# Patient Record
Sex: Female | Born: 1955 | Race: White | Hispanic: No | Marital: Single | Smoking: Never smoker
Health system: Southern US, Community
[De-identification: ages and names within clinical notes are randomized; demographics above are authoritative.]

## PROBLEM LIST (undated history)

## (undated) HISTORY — PX: ABDOMINAL HYSTERECTOMY: SHX81

## (undated) HISTORY — PX: APPENDECTOMY: SHX54

---

## 2018-06-29 ENCOUNTER — Other Ambulatory Visit: Payer: Self-pay

## 2018-06-29 ENCOUNTER — Emergency Department (HOSPITAL_COMMUNITY): Payer: No Typology Code available for payment source

## 2018-06-29 ENCOUNTER — Inpatient Hospital Stay (HOSPITAL_COMMUNITY)
Admission: EM | Admit: 2018-06-29 | Discharge: 2018-07-02 | DRG: 493 | Disposition: A | Discharge status: Home Health | Payer: No Typology Code available for payment source | Attending: Student | Admitting: Student

## 2018-06-29 ENCOUNTER — Encounter (HOSPITAL_COMMUNITY): Payer: Self-pay | Admitting: Emergency Medicine

## 2018-06-29 DIAGNOSIS — S82201A Unspecified fracture of shaft of right tibia, initial encounter for closed fracture: Secondary | ICD-10-CM | POA: Diagnosis present

## 2018-06-29 DIAGNOSIS — S82451A Displaced comminuted fracture of shaft of right fibula, initial encounter for closed fracture: Secondary | ICD-10-CM | POA: Diagnosis present

## 2018-06-29 DIAGNOSIS — Y92009 Unspecified place in unspecified non-institutional (private) residence as the place of occurrence of the external cause: Secondary | ICD-10-CM

## 2018-06-29 DIAGNOSIS — S82142A Displaced bicondylar fracture of left tibia, initial encounter for closed fracture: Secondary | ICD-10-CM

## 2018-06-29 DIAGNOSIS — S82141A Displaced bicondylar fracture of right tibia, initial encounter for closed fracture: Secondary | ICD-10-CM | POA: Diagnosis not present

## 2018-06-29 DIAGNOSIS — I959 Hypotension, unspecified: Secondary | ICD-10-CM | POA: Diagnosis not present

## 2018-06-29 DIAGNOSIS — D62 Acute posthemorrhagic anemia: Secondary | ICD-10-CM | POA: Diagnosis not present

## 2018-06-29 DIAGNOSIS — Z90710 Acquired absence of both cervix and uterus: Secondary | ICD-10-CM

## 2018-06-29 DIAGNOSIS — M79604 Pain in right leg: Secondary | ICD-10-CM | POA: Diagnosis not present

## 2018-06-29 DIAGNOSIS — Z1159 Encounter for screening for other viral diseases: Secondary | ICD-10-CM

## 2018-06-29 DIAGNOSIS — W109XXA Fall (on) (from) unspecified stairs and steps, initial encounter: Secondary | ICD-10-CM | POA: Diagnosis present

## 2018-06-29 DIAGNOSIS — Z419 Encounter for procedure for purposes other than remedying health state, unspecified: Secondary | ICD-10-CM

## 2018-06-29 LAB — CBC WITH DIFFERENTIAL/PLATELET
Abs Immature Granulocytes: 0.05 10*3/uL (ref 0.00–0.07)
Basophils Absolute: 0 10*3/uL (ref 0.0–0.1)
Basophils Relative: 0 %
Eosinophils Absolute: 0 10*3/uL (ref 0.0–0.5)
Eosinophils Relative: 0 %
HCT: 36.9 % (ref 36.0–46.0)
Hemoglobin: 12 g/dL (ref 12.0–15.0)
Immature Granulocytes: 0 %
Lymphocytes Relative: 5 %
Lymphs Abs: 0.6 10*3/uL — ABNORMAL LOW (ref 0.7–4.0)
MCH: 30.5 pg (ref 26.0–34.0)
MCHC: 32.5 g/dL (ref 30.0–36.0)
MCV: 93.9 fL (ref 80.0–100.0)
Monocytes Absolute: 0.6 10*3/uL (ref 0.1–1.0)
Monocytes Relative: 5 %
Neutro Abs: 11.6 10*3/uL — ABNORMAL HIGH (ref 1.7–7.7)
Neutrophils Relative %: 90 %
Platelets: 208 10*3/uL (ref 150–400)
RBC: 3.93 MIL/uL (ref 3.87–5.11)
RDW: 12.6 % (ref 11.5–15.5)
WBC: 12.9 10*3/uL — ABNORMAL HIGH (ref 4.0–10.5)
nRBC: 0 % (ref 0.0–0.2)

## 2018-06-29 LAB — BASIC METABOLIC PANEL
Anion gap: 11 (ref 5–15)
BUN: 19 mg/dL (ref 8–23)
CO2: 24 mmol/L (ref 22–32)
Calcium: 9.2 mg/dL (ref 8.9–10.3)
Chloride: 107 mmol/L (ref 98–111)
Creatinine, Ser: 0.79 mg/dL (ref 0.44–1.00)
GFR calc Af Amer: >60 mL/min (ref >60)
GFR calc non Af Amer: >60 mL/min (ref >60)
Glucose, Bld: 157 mg/dL — ABNORMAL HIGH (ref 70–99)
Potassium: 3.6 mmol/L (ref 3.5–5.1)
Sodium: 142 mmol/L (ref 135–145)

## 2018-06-29 LAB — SARS CORONAVIRUS 2 BY RT PCR (HOSPITAL ORDER, PERFORMED IN ~~LOC~~ HOSPITAL LAB): SARS Coronavirus 2: NEGATIVE

## 2018-06-29 MED ORDER — METHOCARBAMOL 500 MG PO TABS
500.0000 mg | ORAL_TABLET | Freq: Four times a day (QID) | ORAL | Status: DC | PRN
  Administered 2018-06-29 – 2018-07-01 (×4): 500 mg via ORAL
  Filled 2018-06-29 (×4): qty 1

## 2018-06-29 MED ORDER — HYDROMORPHONE HCL 1 MG/ML IJ SOLN
1.0000 mg | Freq: Once | INTRAMUSCULAR | Status: AC
  Administered 2018-06-29: 1 mg via INTRAVENOUS
  Filled 2018-06-29: qty 1

## 2018-06-29 MED ORDER — ACETAMINOPHEN 500 MG PO TABS
1000.0000 mg | ORAL_TABLET | Freq: Three times a day (TID) | ORAL | Status: DC
  Administered 2018-06-29 – 2018-07-02 (×7): 1000 mg via ORAL
  Filled 2018-06-29 (×8): qty 2

## 2018-06-29 MED ORDER — LACTATED RINGERS IV SOLN
INTRAVENOUS | Status: DC
  Administered 2018-06-29: 23:00:00 via INTRAVENOUS

## 2018-06-29 MED ORDER — ONDANSETRON HCL 4 MG/2ML IJ SOLN
4.0000 mg | Freq: Four times a day (QID) | INTRAMUSCULAR | Status: DC | PRN
  Administered 2018-06-30 (×2): 4 mg via INTRAVENOUS
  Filled 2018-06-29 (×2): qty 2

## 2018-06-29 MED ORDER — OXYCODONE HCL 5 MG PO TABS
5.0000 mg | ORAL_TABLET | ORAL | Status: DC | PRN
  Administered 2018-06-29 – 2018-06-30 (×3): 10 mg via ORAL
  Administered 2018-06-30: 5 mg via ORAL
  Administered 2018-07-01 – 2018-07-02 (×4): 10 mg via ORAL
  Filled 2018-06-29 (×8): qty 2

## 2018-06-29 MED ORDER — CELECOXIB 200 MG PO CAPS
200.0000 mg | ORAL_CAPSULE | Freq: Two times a day (BID) | ORAL | Status: DC
  Administered 2018-06-29: 200 mg via ORAL
  Filled 2018-06-29: qty 1

## 2018-06-29 MED ORDER — DIPHENHYDRAMINE HCL 12.5 MG/5ML PO ELIX: 12.5000 mg | ORAL_SOLUTION | ORAL | Status: DC | PRN

## 2018-06-29 MED ORDER — ONDANSETRON HCL 4 MG PO TABS: 4.0000 mg | ORAL_TABLET | Freq: Four times a day (QID) | ORAL | Status: DC | PRN

## 2018-06-29 MED ORDER — ONDANSETRON HCL 4 MG/2ML IJ SOLN
4.0000 mg | Freq: Once | INTRAMUSCULAR | Status: AC
  Administered 2018-06-29: 4 mg via INTRAVENOUS
  Filled 2018-06-29: qty 2

## 2018-06-29 MED ORDER — SENNOSIDES-DOCUSATE SODIUM 8.6-50 MG PO TABS: 1.0000 | ORAL_TABLET | Freq: Every evening | ORAL | Status: DC | PRN

## 2018-06-29 MED ORDER — METHOCARBAMOL 1000 MG/10ML IJ SOLN
500.0000 mg | Freq: Four times a day (QID) | INTRAVENOUS | Status: DC | PRN
  Filled 2018-06-29: qty 5

## 2018-06-29 MED ORDER — HYDROMORPHONE HCL 1 MG/ML IJ SOLN
1.0000 mg | Freq: Once | INTRAMUSCULAR | Status: AC
  Administered 2018-06-29: 21:00:00 1 mg via INTRAVENOUS
  Filled 2018-06-29: qty 1

## 2018-06-29 MED ORDER — HYDROMORPHONE HCL 1 MG/ML IJ SOLN
1.0000 mg | Freq: Once | INTRAMUSCULAR | Status: AC
  Administered 2018-06-29: 22:00:00 1 mg via INTRAVENOUS
  Filled 2018-06-29: qty 1

## 2018-06-29 MED ORDER — HYDROMORPHONE HCL 1 MG/ML IJ SOLN: 0.5000 mg | INTRAMUSCULAR | Status: DC | PRN

## 2018-06-29 MED ORDER — DOCUSATE SODIUM 100 MG PO CAPS
100.0000 mg | ORAL_CAPSULE | Freq: Two times a day (BID) | ORAL | Status: DC
  Administered 2018-06-29 – 2018-07-02 (×5): 100 mg via ORAL
  Filled 2018-06-29 (×5): qty 1

## 2018-06-29 NOTE — ED Notes (Signed)
ED TO INPATIENT HANDOFF REPORT  ED Nurse Name and Phone #: [phone] Theresa Dean  S Name/Age/Gender Theresa Dean 63 y.o. female Room/Bed: 043C/043C  Code Status   Code Status: Not on file  Home/SNF/Other Home Patient oriented to: self, place, time and situation Is this baseline? Yes   Triage Complete: Triage complete  Chief Complaint fracture  Triage Note Pt brought in by [facility], Pt fell down approximately 6 steps while carrying a cabinet. Pt has obvious deformity and significant swelling to RLE. EMS placed a splint on RLE Pt received 150 mcg of fentanyl and 8 mg of zofran en route due to having 2 episodes of vomiting after receiving fentanyl.. Pt denies head or neck pain, back pain, LOC, denies hitting her head.    Allergies No Known Allergies  Level of Care/Admitting Diagnosis ED Disposition    ED Disposition Condition Comment   Admit  [facility]ospital Area: [facility] [[id number]]  Level of Care: Med-Surg [16]  Covid Evaluation: N/A  Diagnosis: Right tibial fracture [454098][[id number]]  Admitting Physician: Bjorn PippinVARKEY, DAX T [[id number]]  Attending Physician: Bjorn PippinVARKEY, DAX T [[id number]]  PT Class (Do Not Modify): Observation [104]  PT Acc Code (Do Not Modify): Observation [10022]       B Medical/Surgery History History reviewed. No pertinent past medical history. Past Surgical History:  Procedure Laterality Date  . ABDOMINAL HYSTERECTOMY    . APPENDECTOMY       A IV Location/Drains/Wounds Patient Lines/Drains/Airways Status   Active Line/Drains/Airways    Name:   Placement date:   Placement time:   Site:   Days:   Peripheral IV 06/29/18 Right Antecubital   06/29/18    1735    Antecubital   less than 1          Intake/Output Last 24 hours No intake or output data in the 24 hours ending 06/29/18 2129  Labs/Imaging Results for orders placed or performed during the hospital encounter of 06/29/18 (from the past 48 hour(s))  Basic metabolic panel     Status: Abnormal    Collection Time: 06/29/18  8:38 PM  Result Value Ref Range   Sodium 142 135 - 145 mmol/L   Potassium 3.6 3.5 - 5.1 mmol/L   Chloride 107 98 - 111 mmol/L   CO2 24 22 - 32 mmol/L   Glucose, Bld 157 (H) 70 - 99 mg/dL   BUN 19 8 - 23 mg/dL   Creatinine, Ser 0.860.79 0.44 - 1.00 mg/dL   Calcium 9.2 8.9 - 57.810.3 mg/dL   GFR calc non Af Amer >60 >60 mL/min   GFR calc Af Amer >60 >60 mL/min   Anion gap 11 5 - 15    Comment: Performed at [facility], [address] [place], [place] [address]  CBC with Differential     Status: Abnormal   Collection Time: 06/29/18  8:38 PM  Result Value Ref Range   WBC 12.9 (H) 4.0 - 10.5 K/uL   RBC 3.93 3.87 - 5.11 MIL/uL   Hemoglobin 12.0 12.0 - 15.0 g/dL   HCT 95.236.9 84.136.0 - 32.446.0 %   MCV 93.9 80.0 - 100.0 fL   MCH 30.5 26.0 - 34.0 pg   MCHC 32.5 30.0 - 36.0 g/dL   RDW 40.112.6 02.711.5 - 25.315.5 %   Platelets 208 150 - 400 K/uL   nRBC 0.0 0.0 - 0.2 %   Neutrophils Relative % 90 %   Neutro Abs 11.6 (H) 1.7 - 7.7 K/uL   Lymphocytes Relative  5 %   Lymphs Abs 0.6 (L) 0.7 - 4.0 K/uL   Monocytes Relative 5 %   Monocytes Absolute 0.6 0.1 - 1.0 K/uL   Eosinophils Relative 0 %   Eosinophils Absolute 0.0 0.0 - 0.5 K/uL   Basophils Relative 0 %   Basophils Absolute 0.0 0.0 - 0.1 K/uL   Immature Granulocytes 0 %   Abs Immature Granulocytes 0.05 0.00 - 0.07 K/uL    Comment: Performed at [facility], [place] [address], [place], [place] [address]   Dg Tibia/fibula Right  Result Date: 06/29/2018 CLINICAL DATA:  63 year old female with fall and right lower extremity pain. EXAM: RIGHT TIBIA AND FIBULA - 2 VIEW COMPARISON:  None. FINDINGS: There is a displaced oblique and comminuted fracture of the proximal tibia with extension of the fracture into the articular surface. Mildly depressed fracture component of the posterior aspect of the lateral tibial plateau noted. There is overall approximately 1 cm lateral displacement of the tibial shaft in relation to the major  proximal fracture fragment. There is a mildly displaced and comminuted appearing fracture of the fibular head. No dislocation. The bones are osteopenic. The ankle mortise is intact. There is moderate suprapatellar effusion, likely a hemarthrosis. There is soft tissue swelling of the anterior calf. IMPRESSION: Comminuted and displaced fractures of the proximal tibia and fracture of the fibular head as described. Electronically Signed   By: Anner Crete M.D.   On: 06/29/2018 20:10   Ct Knee Right Wo Contrast  Result Date: 06/29/2018 CLINICAL DATA:  [address] downstairs. Evaluate complex tibial plateau fractures. EXAM: CT OF THE right KNEE WITHOUT CONTRAST TECHNIQUE: Multidetector CT imaging of the right knee was performed according to the standard protocol. Multiplanar CT image reconstructions were also generated. COMPARISON:  Radiograph 06/29/2018 FINDINGS: Complex comminuted fractures of the tibial plateau both medially and laterally. The lateral tibial plateau fracture is a comminuted die punch type fracture with depression of the central fragment approximately 6 mm. There is a long oblique coursing fracture through the metadiaphyseal region of the proximal tibia and comminuted fracture involving the lateral cortex below the joint. Depressed medial tibial plateau fracture with maximum depression of 4.5 mm 4 mm defect along the articular surface. There is also a comminuted fracture of the medial cortex of the tibial metaphysis. Complex comminuted and displaced fractures of the fibular head and neck. No fracture of the femur or patella. Large lipohemarthrosis. Grossly the cruciate ligaments are intact. The medial collateral ligament also appears to be intact but the lateral collateral ligament complex is not well demonstrated other than the intact iliotibial band. The quadriceps and patellar tendons are intact. Large lipohemarthrosis. IMPRESSION: Complex comminuted medial and lateral tibial plateau fractures and  fractures through the metadiaphyseal region of the tibia. Complex comminuted fractures of the fibular head and neck. Large lipohemarthrosis. Electronically Signed   By: Marijo Sanes M.D.   On: 06/29/2018 20:57    Pending Labs Unresulted Labs (From admission, onward)    Start     Ordered   06/29/18 2003  SARS Coronavirus 2 (CEPHEID - Performed in [place] hospital lab), Hosp Order  (Asymptomatic Patients Labs)  Once,   STAT    Question:  Rule Out  Answer:  Yes   06/29/18 2002   Signed and Held  HIV antibody (Routine Testing)  Once,   R     Signed and Held   Signed and [employer]  Once,   [employer]  and Held          Vitals/Pain Today's Vitals   06/29/18 1921 06/29/18 1927 06/29/18 2125 06/29/18 2127  BP:    (!) 147/71  Pulse:    71  Resp:    18  Temp:      TempSrc:      SpO2:    100%  Weight:  62.6 kg    Height:  5\' 4"  (1.626 m)    PainSc: 8   9      Isolation Precautions No active isolations  Medications Medications  HYDROmorphone (DILAUDID) injection 1 mg (has no administration in time range)  HYDROmorphone (DILAUDID) injection 1 mg (1 mg Intravenous Given 06/29/18 1801)  ondansetron (ZOFRAN) injection 4 mg (4 mg Intravenous Given 06/29/18 1824)  HYDROmorphone (DILAUDID) injection 1 mg (1 mg Intravenous Given 06/29/18 1915)  ondansetron (ZOFRAN) injection 4 mg (4 mg Intravenous Given 06/29/18 1915)  HYDROmorphone (DILAUDID) injection 1 mg (1 mg Intravenous Given 06/29/18 2047)  ondansetron (ZOFRAN) injection 4 mg (4 mg Intravenous Given 06/29/18 2047)    Mobility walks Moderate fall risk(due to injury)   Focused Assessments Musculoskeletal assessment   R Recommendations: See Admitting Provider Note  Report given to:   Additional Notes: Will medicate prior to transport.  Pt has increased pain post knee immobilizer being placed to hold leg together.

## 2018-06-29 NOTE — ED Triage Notes (Signed)
Pt brought in by [facility], Pt fell down approximately 6 steps while carrying a cabinet. Pt has obvious deformity and significant swelling to RLE. EMS placed a splint on RLE Pt received 150 mcg of fentanyl and 8 mg of zofran en route due to having 2 episodes of vomiting after receiving fentanyl.. Pt denies head or neck pain, back pain, LOC, denies hitting her head.

## 2018-06-29 NOTE — ED Provider Notes (Signed)
[facility] EMERGENCY DEPARTMENT Provider Note   CSN: [id number] Arrival date & time: 06/29/18  1726    History   Chief Complaint Chief Complaint  Patient presents with  . Fall  . Leg Injury    HPI Theresa Dean is a 63 y.o. female.     HPI   Resents by EMS for evaluation of isolated injury to the right lower leg, when she fell down several steps, at home.  She is not sure why she fell but thinks she slipped.  She was treated for pain with IV fentanyl and Zofran for nausea, improved.  Denies headache, neck pain, chest pain, weakness or dizziness.  There are no other known modifying factors.  History reviewed. No pertinent past medical history.  There are no active problems to display for this patient.   Past Surgical History:  Procedure Laterality Date  . ABDOMINAL HYSTERECTOMY    . APPENDECTOMY       OB History   No obstetric history on file.      Home Medications    Prior to Admission medications   Not on File    Family History No family history on file.  Social History Social History   Tobacco Use  . Smoking status: Never Smoker  . Smokeless tobacco: Never Used  Substance Use Topics  . Alcohol use: Yes    Comment: occasional  . Drug use: Never     Allergies   Patient has no known allergies.   Review of Systems Review of Systems  All other systems reviewed and are negative.    Physical Exam Updated Vital Signs BP (!) 146/81   Pulse 69   Temp 97.6 F (36.4 C) (Oral)   Resp 10   Ht 5\' 4"  (1.626 m)   Wt 62.6 kg   SpO2 100%   BMI 23.69 kg/m   Physical Exam Vitals signs and nursing note reviewed.  Constitutional:      General: She is not in acute distress.    Appearance: She is well-developed. She is not ill-appearing, toxic-appearing or diaphoretic.  HENT:     Head: Normocephalic and atraumatic.     Right Ear: External ear normal.     Left Ear: External ear normal.     Mouth/Throat:     Pharynx: No  oropharyngeal exudate or posterior oropharyngeal erythema.  Eyes:     Conjunctiva/sclera: Conjunctivae normal.     Pupils: Pupils are equal, round, and reactive to light.  Neck:     Musculoskeletal: Normal range of motion and neck supple.     Trachea: Phonation normal.  Cardiovascular:     Rate and Rhythm: Normal rate and regular rhythm.     Heart sounds: Normal heart sounds.  Pulmonary:     Effort: Pulmonary effort is normal.     Breath sounds: Normal breath sounds.  Abdominal:     Palpations: Abdomen is soft.     Tenderness: There is no abdominal tenderness.  Musculoskeletal:     Comments: Tenderness of the cervical spine.  Right leg with tenderness, swelling and ecchymosis, below the knee, primarily lateral.  Neurovascularly and functionally intact distally in the right foot.  Skin:    General: Skin is warm and dry.  Neurological:     Mental Status: She is alert and oriented to person, place, and time.     Cranial Nerves: No cranial nerve deficit.     Sensory: No sensory deficit.     Motor: No abnormal muscle  tone.     Coordination: Coordination normal.  Psychiatric:        Mood and Affect: Mood normal.        Behavior: Behavior normal.        Thought Content: Thought content normal.        Judgment: Judgment normal.      ED Treatments / Results  Labs (all labs ordered are listed, but only abnormal results are displayed) Labs Reviewed  SARS CORONAVIRUS 2 (HOSPITAL ORDER, [place] [facility])  BASIC METABOLIC PANEL  CBC WITH DIFFERENTIAL/PLATELET    EKG None  Radiology No results found.  Procedures Procedures (including critical care time)  Medications Ordered in ED Medications  HYDROmorphone (DILAUDID) injection 1 mg (1 mg Intravenous Given 06/29/18 1801)  ondansetron (ZOFRAN) injection 4 mg (4 mg Intravenous Given 06/29/18 1824)  HYDROmorphone (DILAUDID) injection 1 mg (1 mg Intravenous Given 06/29/18 1915)  ondansetron (ZOFRAN) injection 4  mg (4 mg Intravenous Given 06/29/18 1915)     Initial Impression / Assessment and Plan / ED Course  I have reviewed the triage vital signs and the nursing notes.  Pertinent labs & imaging results that were available during my care of the patient were reviewed by me and considered in my medical decision making (see chart for details).  Clinical Course as of Jun 28 2001  Tue Jun 29, 2018  2002 Displaced proximal tibia and fibula fracture, images reviewed by me  DG Tibia/Fibula Right [EW]    Clinical Course User Index [EW] Daleen Bo, MD        Patient Vitals for the past 24 hrs:  BP Temp Temp src Pulse Resp SpO2 Height Weight  06/29/18 1927 - - - - - - 5\' 4"  (1.626 m) 62.6 kg  06/29/18 1915 (!) 146/81 - - 69 10 100 % - -  06/29/18 1900 136/77 - - 71 11 100 % - -  06/29/18 1845 134/81 - - 74 15 99 % - -  06/29/18 1830 (!) 144/82 - - 78 13 99 % - -  06/29/18 1815 134/74 - - 71 15 97 % - -  06/29/18 1800 132/70 - - 64 11 100 % - -  06/29/18 1745 136/68 - - 68 16 98 % - -  06/29/18 1739 139/78 97.6 F (36.4 C) Oral 68 18 98 % - -    8:03 PM Reevaluation with update and discussion. After initial assessment and treatment, an updated evaluation reveals she is more comfortable at this time.  Right leg evaluated again, there is no break in the skin.  She continues to have sensation and function of the right foot.  Patient is agreeable to hospitalization and further work-up.  Orthopedics has been paged. Daleen Bo   Medical Decision Making: Mechanical fall with isolated injury proximal right lower leg.  Patient will require intervention with surgical repair, prior to discharge from the hospital  CRITICAL CARE-no Performed by: Daleen Bo   Nursing Notes Reviewed/ Care Coordinated Applicable Imaging Reviewed Interpretation of Laboratory Data incorporated into ED treatment  8:05 PM-discussed with Dr. Griffin Basil, he will put in admission orders and see the patient tomorrow for  operative intervention.  Request that she be placed in a knee immobilizer.  Plan, admit to orthopedics.   Final Clinical Impressions(s) / ED Diagnoses   Final diagnoses:  Tibia/fibula fracture, right, closed, initial encounter    ED Discharge Orders    None       Daleen Bo, MD 06/29/18  2008  

## 2018-06-29 NOTE — Consult Note (Signed)
Called about R tibia fracture.  Patient reportedly per ER has no signs of compartment syndrome, moderate swelling, NVID.  Plan for ORIF vs Ex fix tomorrow likely w Dr. Doreatha Martin.  NPO after midnight.  Posting for OR.

## 2018-06-30 ENCOUNTER — Observation Stay (HOSPITAL_COMMUNITY): Payer: No Typology Code available for payment source | Admitting: Certified Registered Nurse Anesthetist

## 2018-06-30 ENCOUNTER — Encounter (HOSPITAL_COMMUNITY): Payer: Self-pay | Admitting: *Deleted

## 2018-06-30 ENCOUNTER — Observation Stay (HOSPITAL_COMMUNITY): Payer: No Typology Code available for payment source

## 2018-06-30 ENCOUNTER — Encounter (HOSPITAL_COMMUNITY)
Admission: EM | Disposition: A | Discharge status: Home Health | Payer: Self-pay | Source: Home / Self Care | Attending: Student

## 2018-06-30 HISTORY — PX: ORIF TIBIA PLATEAU: SHX2132

## 2018-06-30 LAB — SURGICAL PCR SCREEN
MRSA, PCR: NEGATIVE
Staphylococcus aureus: NEGATIVE

## 2018-06-30 LAB — PROTIME-INR
INR: 1.1 (ref 0.8–1.2)
Prothrombin Time: 13.8 seconds (ref 11.4–15.2)

## 2018-06-30 LAB — HIV ANTIBODY (ROUTINE TESTING W REFLEX): HIV Screen 4th Generation wRfx: NONREACTIVE

## 2018-06-30 IMAGING — DX PORTABLE RIGHT KNEE - 1-2 VIEW
3 series · 3 of 3 positions shown · non-contrast
Comparison: [DATE], [DATE]

CLINICAL DATA: Postop

EXAM:
PORTABLE RIGHT KNEE - 1-2 VIEW

[knee ap (1 of 2)]
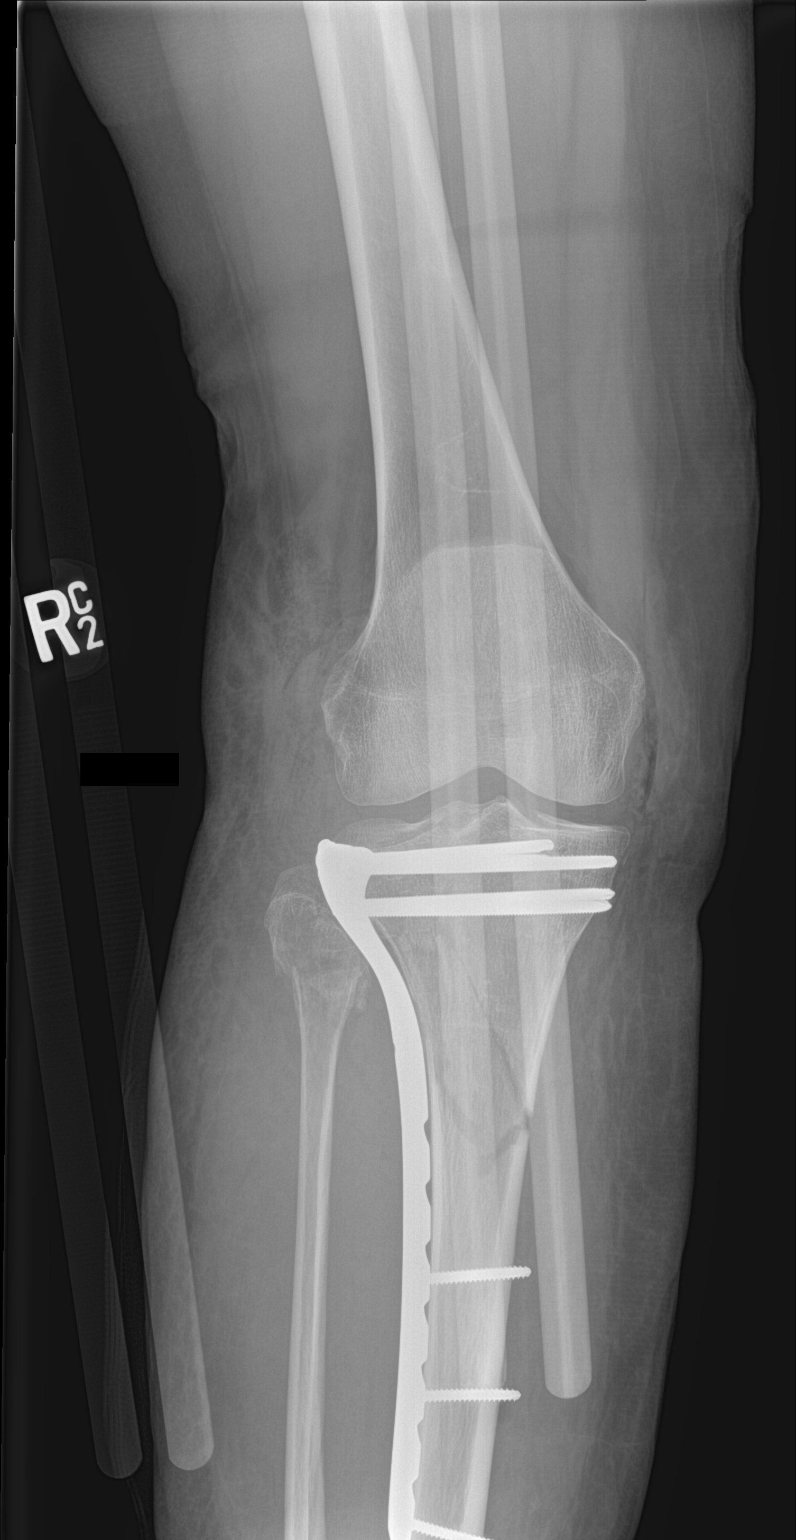

[knee lat]
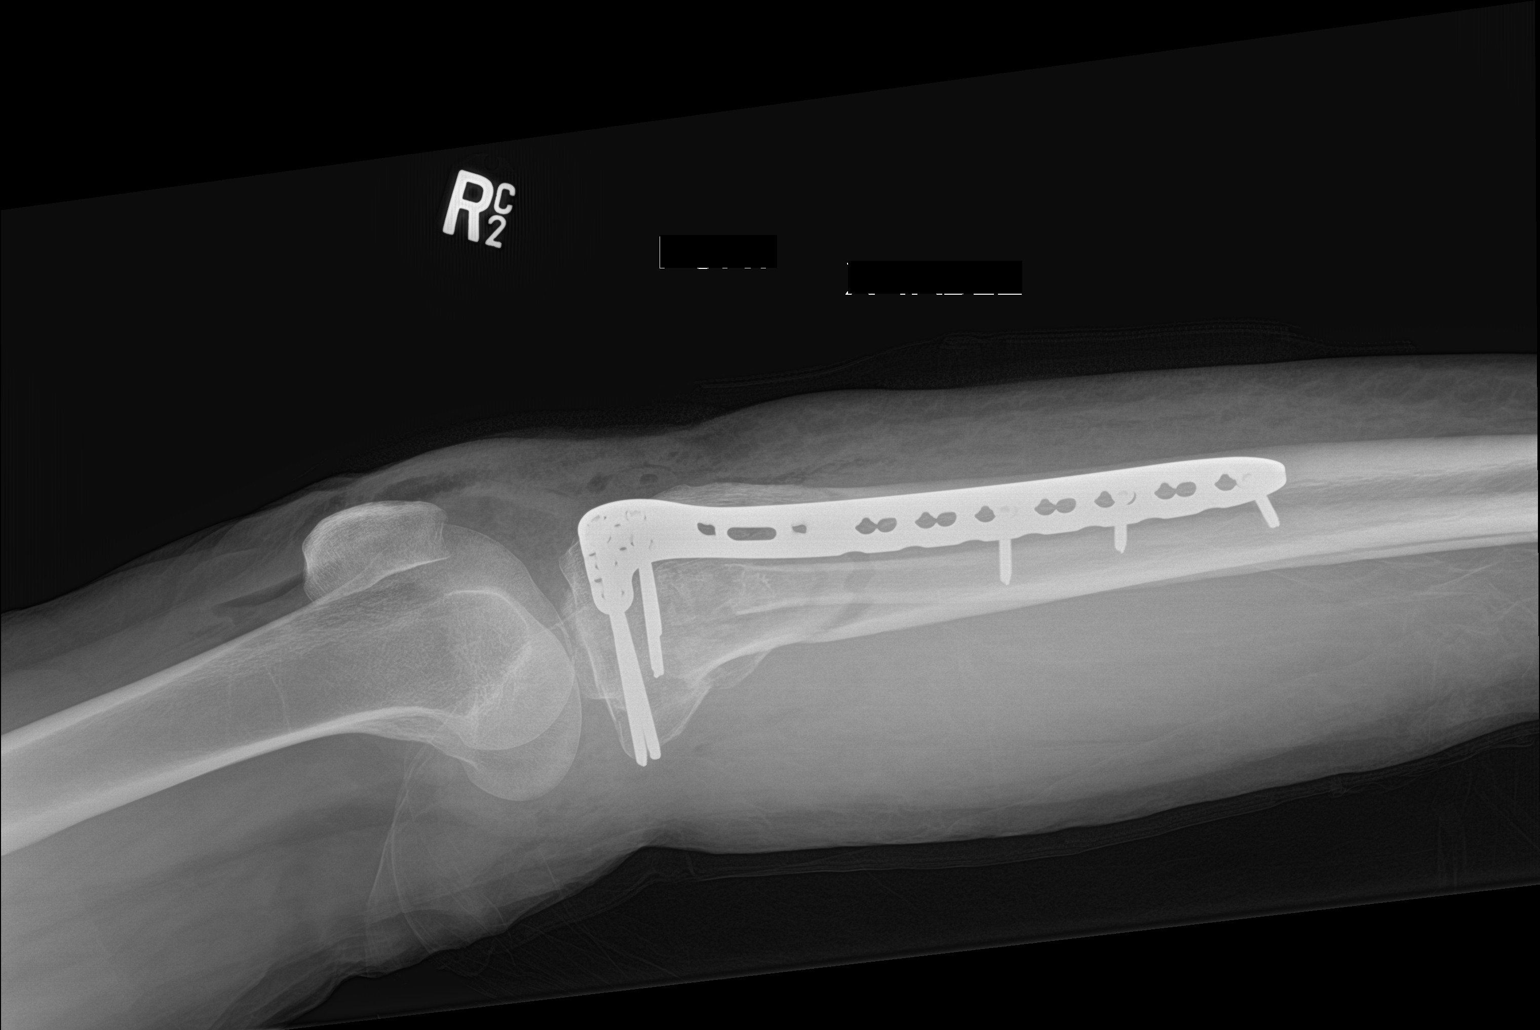

[knee ap (2 of 2)]
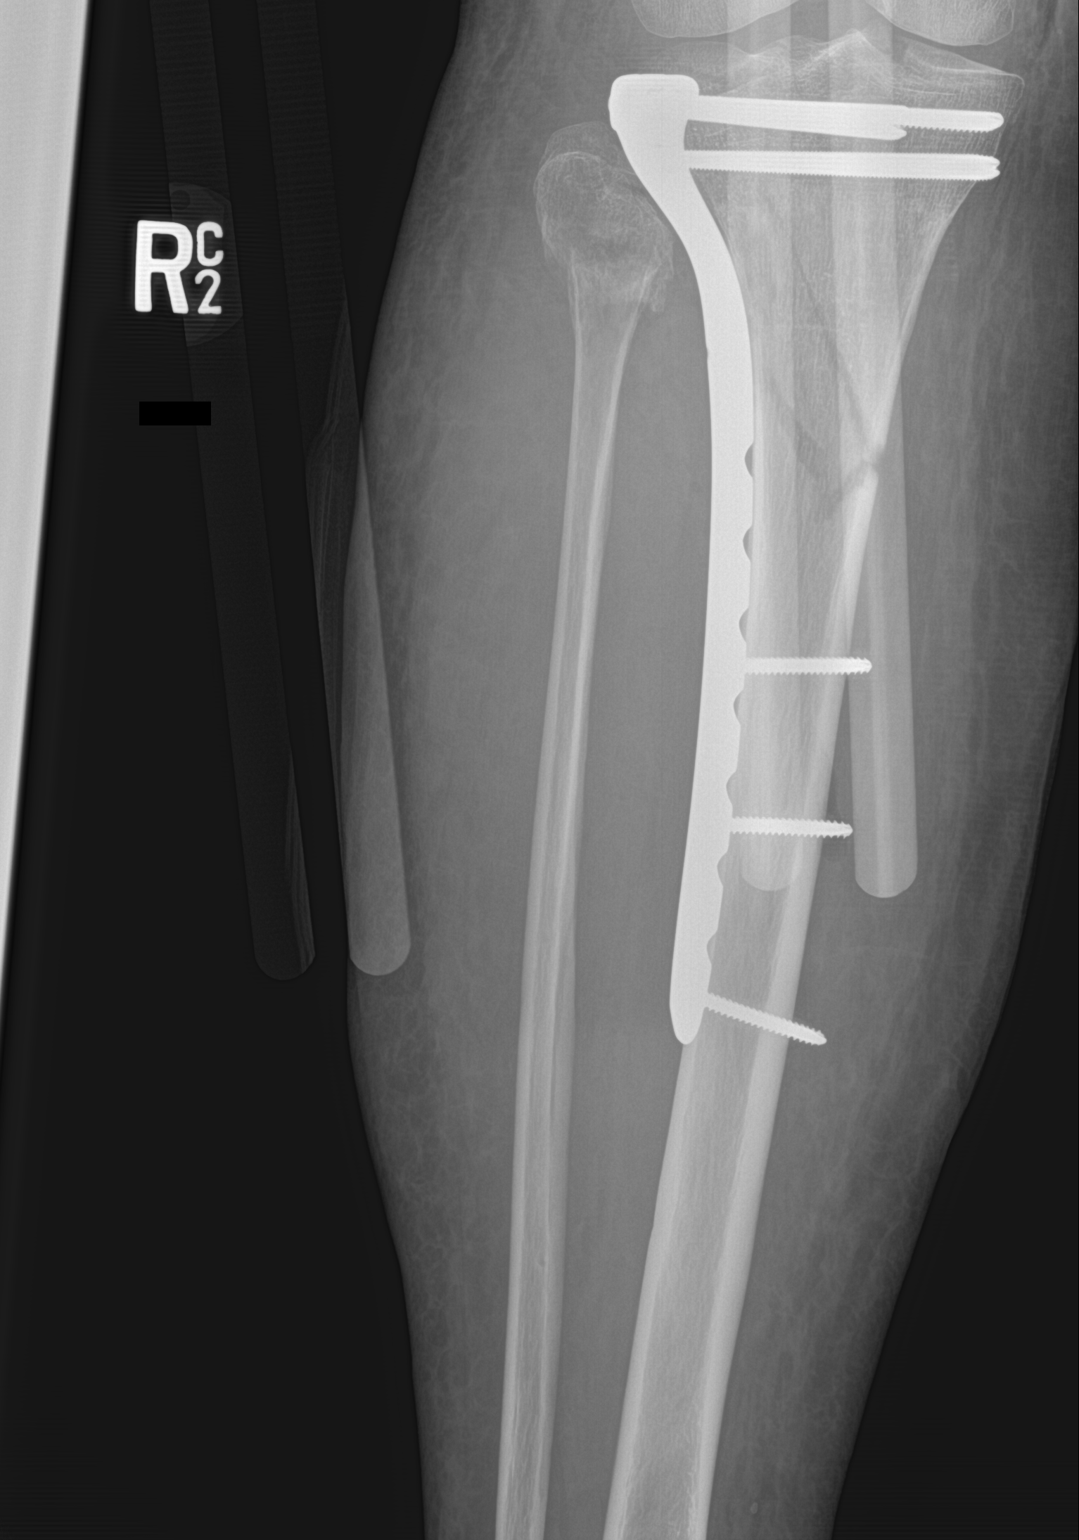

[3 of 3 positions shown; findings below may reference images not displayed]

FINDINGS: Interval surgical plate and screw fixation of the tibia for
comminuted proximal tibial/tibial plateau fracture, now with
anatomic alignment. Acute mildly comminuted and impacted fracture
involving the fibular head and neck. Diffuse soft tissue swelling.
Gas in the soft tissues and knee joint consistent with recent
surgery.
IMPRESSION: 1. Interval surgical plate and multiple screw fixation of comminuted
proximal tibial fracture
2. No significant change in alignment of acute comminuted proximal
fibular fracture

## 2018-06-30 SURGERY — OPEN REDUCTION INTERNAL FIXATION (ORIF) TIBIAL PLATEAU
Anesthesia: General | Site: Leg Lower | Laterality: Right

## 2018-06-30 MED ORDER — ROCURONIUM BROMIDE 10 MG/ML (PF) SYRINGE
PREFILLED_SYRINGE | INTRAVENOUS | Status: DC | PRN
  Administered 2018-06-30: 60 mg via INTRAVENOUS

## 2018-06-30 MED ORDER — ONDANSETRON HCL 4 MG/2ML IJ SOLN
INTRAMUSCULAR | Status: DC | PRN
  Administered 2018-06-30: 4 mg via INTRAVENOUS

## 2018-06-30 MED ORDER — PROPOFOL 10 MG/ML IV BOLUS
INTRAVENOUS | Status: DC | PRN
  Administered 2018-06-30: 120 mg via INTRAVENOUS

## 2018-06-30 MED ORDER — LIDOCAINE 2% (20 MG/ML) 5 ML SYRINGE
INTRAMUSCULAR | Status: AC
  Filled 2018-06-30: qty 5

## 2018-06-30 MED ORDER — CEFAZOLIN SODIUM-DEXTROSE 2-4 GM/100ML-% IV SOLN
2.0000 g | Freq: Three times a day (TID) | INTRAVENOUS | Status: AC
  Administered 2018-06-30 – 2018-07-01 (×3): 2 g via INTRAVENOUS
  Filled 2018-06-30 (×3): qty 100

## 2018-06-30 MED ORDER — GABAPENTIN 100 MG PO CAPS
100.0000 mg | ORAL_CAPSULE | Freq: Three times a day (TID) | ORAL | Status: DC
  Administered 2018-06-30 – 2018-07-02 (×5): 100 mg via ORAL
  Filled 2018-06-30 (×7): qty 1

## 2018-06-30 MED ORDER — DEXAMETHASONE SODIUM PHOSPHATE 10 MG/ML IJ SOLN
INTRAMUSCULAR | Status: DC | PRN
  Administered 2018-06-30: 10 mg via INTRAVENOUS

## 2018-06-30 MED ORDER — DIPHENHYDRAMINE HCL 50 MG/ML IJ SOLN
INTRAMUSCULAR | Status: AC
  Filled 2018-06-30: qty 1

## 2018-06-30 MED ORDER — HYDROMORPHONE HCL 1 MG/ML IJ SOLN
INTRAMUSCULAR | Status: AC
  Filled 2018-06-30: qty 1

## 2018-06-30 MED ORDER — FENTANYL CITRATE (PF) 250 MCG/5ML IJ SOLN
INTRAMUSCULAR | Status: AC
  Filled 2018-06-30: qty 5

## 2018-06-30 MED ORDER — FENTANYL CITRATE (PF) 250 MCG/5ML IJ SOLN
INTRAMUSCULAR | Status: DC | PRN
  Administered 2018-06-30 (×2): 25 ug via INTRAVENOUS
  Administered 2018-06-30 (×4): 50 ug via INTRAVENOUS

## 2018-06-30 MED ORDER — KETOROLAC TROMETHAMINE 30 MG/ML IJ SOLN: 30.0000 mg | Freq: Once | INTRAMUSCULAR | Status: DC | PRN

## 2018-06-30 MED ORDER — VANCOMYCIN HCL 1000 MG IV SOLR
INTRAVENOUS | Status: DC | PRN
  Administered 2018-06-30: 1000 mg

## 2018-06-30 MED ORDER — ACETAMINOPHEN 10 MG/ML IV SOLN
INTRAVENOUS | Status: AC
  Filled 2018-06-30: qty 100

## 2018-06-30 MED ORDER — PROPOFOL 10 MG/ML IV BOLUS
INTRAVENOUS | Status: AC
  Filled 2018-06-30: qty 20

## 2018-06-30 MED ORDER — CEFAZOLIN SODIUM-DEXTROSE 2-4 GM/100ML-% IV SOLN
INTRAVENOUS | Status: AC
  Filled 2018-06-30: qty 100

## 2018-06-30 MED ORDER — HYDROMORPHONE HCL 1 MG/ML IJ SOLN
0.2500 mg | INTRAMUSCULAR | Status: DC | PRN
  Administered 2018-06-30 (×2): 0.5 mg via INTRAVENOUS

## 2018-06-30 MED ORDER — ENOXAPARIN SODIUM 40 MG/0.4ML ~~LOC~~ SOLN
40.0000 mg | SUBCUTANEOUS | Status: DC
  Administered 2018-07-01 – 2018-07-02 (×2): 40 mg via SUBCUTANEOUS
  Filled 2018-06-30 (×2): qty 0.4

## 2018-06-30 MED ORDER — CEFAZOLIN SODIUM-DEXTROSE 2-3 GM-%(50ML) IV SOLR
INTRAVENOUS | Status: DC | PRN
  Administered 2018-06-30: 2 g via INTRAVENOUS

## 2018-06-30 MED ORDER — MIDAZOLAM HCL 2 MG/2ML IJ SOLN
INTRAMUSCULAR | Status: DC | PRN
  Administered 2018-06-30: 2 mg via INTRAVENOUS

## 2018-06-30 MED ORDER — SUCCINYLCHOLINE CHLORIDE 200 MG/10ML IV SOSY
PREFILLED_SYRINGE | INTRAVENOUS | Status: AC
  Filled 2018-06-30: qty 10

## 2018-06-30 MED ORDER — SCOPOLAMINE 1 MG/3DAYS TD PT72
MEDICATED_PATCH | TRANSDERMAL | Status: AC
  Administered 2018-06-30: 1.5 mg via TRANSDERMAL
  Filled 2018-06-30: qty 1

## 2018-06-30 MED ORDER — MEPERIDINE HCL 25 MG/ML IJ SOLN: 6.2500 mg | INTRAMUSCULAR | Status: DC | PRN

## 2018-06-30 MED ORDER — PROMETHAZINE HCL 25 MG/ML IJ SOLN
6.2500 mg | INTRAMUSCULAR | Status: DC | PRN
  Administered 2018-06-30: 6.25 mg via INTRAVENOUS

## 2018-06-30 MED ORDER — MIDAZOLAM HCL 2 MG/2ML IJ SOLN
INTRAMUSCULAR | Status: AC
  Filled 2018-06-30: qty 2

## 2018-06-30 MED ORDER — ACETAMINOPHEN 10 MG/ML IV SOLN
INTRAVENOUS | Status: DC | PRN
  Administered 2018-06-30: 1000 mg via INTRAVENOUS

## 2018-06-30 MED ORDER — SCOPOLAMINE 1 MG/3DAYS TD PT72
1.0000 | MEDICATED_PATCH | TRANSDERMAL | Status: DC
  Administered 2018-06-30: 12:00:00 1.5 mg via TRANSDERMAL

## 2018-06-30 MED ORDER — LACTATED RINGERS IV SOLN
INTRAVENOUS | Status: DC
  Administered 2018-06-30 (×2): via INTRAVENOUS

## 2018-06-30 MED ORDER — DIPHENHYDRAMINE HCL 50 MG/ML IJ SOLN
INTRAMUSCULAR | Status: DC | PRN
  Administered 2018-06-30: 12.5 mg via INTRAVENOUS

## 2018-06-30 MED ORDER — 0.9 % SODIUM CHLORIDE (POUR BTL) OPTIME
TOPICAL | Status: DC | PRN
  Administered 2018-06-30: 1000 mL

## 2018-06-30 MED ORDER — HYDROMORPHONE HCL 1 MG/ML IJ SOLN: 0.5000 mg | INTRAMUSCULAR | Status: DC | PRN

## 2018-06-30 MED ORDER — PHENYLEPHRINE 40 MCG/ML (10ML) SYRINGE FOR IV PUSH (FOR BLOOD PRESSURE SUPPORT)
PREFILLED_SYRINGE | INTRAVENOUS | Status: AC
  Filled 2018-06-30: qty 10

## 2018-06-30 MED ORDER — BACITRACIN ZINC 500 UNIT/GM EX OINT
TOPICAL_OINTMENT | CUTANEOUS | Status: AC
  Filled 2018-06-30: qty 28.35

## 2018-06-30 MED ORDER — OXYCODONE HCL 5 MG PO TABS: 5.0000 mg | ORAL_TABLET | Freq: Once | ORAL | Status: DC | PRN

## 2018-06-30 MED ORDER — SUGAMMADEX SODIUM 200 MG/2ML IV SOLN
INTRAVENOUS | Status: DC | PRN
  Administered 2018-06-30: 150 mg via INTRAVENOUS

## 2018-06-30 MED ORDER — TOBRAMYCIN SULFATE 1.2 G IJ SOLR
INTRAMUSCULAR | Status: AC
  Filled 2018-06-30: qty 1.2

## 2018-06-30 MED ORDER — PHENYLEPHRINE HCL (PRESSORS) 10 MG/ML IV SOLN
INTRAVENOUS | Status: DC | PRN
  Administered 2018-06-30: 120 ug via INTRAVENOUS

## 2018-06-30 MED ORDER — LIDOCAINE 2% (20 MG/ML) 5 ML SYRINGE
INTRAMUSCULAR | Status: DC | PRN
  Administered 2018-06-30: 60 mg via INTRAVENOUS

## 2018-06-30 MED ORDER — OXYCODONE HCL 5 MG/5ML PO SOLN: 5.0000 mg | Freq: Once | ORAL | Status: DC | PRN

## 2018-06-30 MED ORDER — ONDANSETRON HCL 4 MG/2ML IJ SOLN
INTRAMUSCULAR | Status: AC
  Filled 2018-06-30: qty 4

## 2018-06-30 MED ORDER — DEXAMETHASONE SODIUM PHOSPHATE 10 MG/ML IJ SOLN
INTRAMUSCULAR | Status: AC
  Filled 2018-06-30: qty 2

## 2018-06-30 MED ORDER — VANCOMYCIN HCL 1000 MG IV SOLR
INTRAVENOUS | Status: AC
  Filled 2018-06-30: qty 1000

## 2018-06-30 MED ORDER — PROMETHAZINE HCL 25 MG/ML IJ SOLN
INTRAMUSCULAR | Status: AC
  Filled 2018-06-30: qty 1

## 2018-06-30 MED ORDER — ROCURONIUM BROMIDE 10 MG/ML (PF) SYRINGE
PREFILLED_SYRINGE | INTRAVENOUS | Status: AC
  Filled 2018-06-30: qty 10

## 2018-06-30 MED ORDER — BACITRACIN 500 UNIT/GM EX OINT
TOPICAL_OINTMENT | CUTANEOUS | Status: DC | PRN
  Administered 2018-06-30: 1 via TOPICAL

## 2018-06-30 SURGICAL SUPPLY — 79 items
BANDAGE ACE 4X5 VEL STRL LF (GAUZE/BANDAGES/DRESSINGS) ×3 IMPLANT
BANDAGE ACE 6X5 VEL STRL LF (GAUZE/BANDAGES/DRESSINGS) ×2 IMPLANT
BANDAGE ELASTIC 4 VELCRO ST LF (GAUZE/BANDAGES/DRESSINGS) ×3 IMPLANT
BANDAGE ELASTIC 6 VELCRO ST LF (GAUZE/BANDAGES/DRESSINGS) ×3 IMPLANT
BANDAGE ESMARK 6X9 LF (GAUZE/BANDAGES/DRESSINGS) ×1 IMPLANT
BIT DRILL 2.5 X LONG (BIT) ×1
BIT DRILL CALIBR QC 2.8X250 (BIT) ×3 IMPLANT
BIT DRILL X LONG 2.5 (BIT) ×1 IMPLANT
BLADE CLIPPER SURG (BLADE) IMPLANT
BLADE SURG 15 STRL LF DISP TIS (BLADE) ×1 IMPLANT
BLADE SURG 15 STRL SS (BLADE) ×2
BNDG ESMARK 6X9 LF (GAUZE/BANDAGES/DRESSINGS) ×3
BNDG GAUZE ELAST 4 BULKY (GAUZE/BANDAGES/DRESSINGS) ×3 IMPLANT
BRUSH SCRUB SURG 4.25 DISP (MISCELLANEOUS) ×6 IMPLANT
CANISTER SUCT 3000ML PPV (MISCELLANEOUS) ×3 IMPLANT
CHLORAPREP W/TINT 26ML (MISCELLANEOUS) ×6 IMPLANT
COVER SURGICAL LIGHT HANDLE (MISCELLANEOUS) ×3 IMPLANT
COVER WAND RF STERILE (DRAPES) ×3 IMPLANT
CUFF TOURNIQUET SINGLE 34IN LL (TOURNIQUET CUFF) ×3 IMPLANT
DRAPE C-ARM 42X72 X-RAY (DRAPES) ×3 IMPLANT
DRAPE C-ARMOR (DRAPES) ×3 IMPLANT
DRAPE ORTHO SPLIT 77X108 STRL (DRAPES) ×4
DRAPE SURG ORHT 6 SPLT 77X108 (DRAPES) ×2 IMPLANT
DRAPE U-SHAPE 47X51 STRL (DRAPES) ×3 IMPLANT
DRILL BIT X LONG 2.5 (BIT) ×2
DRSG ADAPTIC 3X8 NADH LF (GAUZE/BANDAGES/DRESSINGS) ×3 IMPLANT
DRSG PAD ABDOMINAL 8X10 ST (GAUZE/BANDAGES/DRESSINGS) ×6 IMPLANT
ELECT REM PT RETURN 9FT ADLT (ELECTROSURGICAL) ×3
ELECTRODE REM PT RTRN 9FT ADLT (ELECTROSURGICAL) ×1 IMPLANT
GAUZE SPONGE 4X4 12PLY STRL (GAUZE/BANDAGES/DRESSINGS) ×3 IMPLANT
GAUZE SPONGE 4X4 12PLY STRL LF (GAUZE/BANDAGES/DRESSINGS) ×3 IMPLANT
GLOVE BIO SURGEON STRL SZ 6.5 (GLOVE) ×6 IMPLANT
GLOVE BIO SURGEON STRL SZ7.5 (GLOVE) ×12 IMPLANT
GLOVE BIO SURGEONS STRL SZ 6.5 (GLOVE) ×3
GLOVE BIOGEL PI IND STRL 6.5 (GLOVE) ×1 IMPLANT
GLOVE BIOGEL PI IND STRL 7.5 (GLOVE) ×1 IMPLANT
GLOVE BIOGEL PI INDICATOR 6.5 (GLOVE) ×2
GLOVE BIOGEL PI INDICATOR 7.5 (GLOVE) ×2
GOWN STRL REUS W/ TWL LRG LVL3 (GOWN DISPOSABLE) ×2 IMPLANT
GOWN STRL REUS W/TWL LRG LVL3 (GOWN DISPOSABLE) ×4
IMMOBILIZER KNEE 22 UNIV (SOFTGOODS) ×3 IMPLANT
K-WIRE 1.6X150 (WIRE) ×12
KIT BASIN OR (CUSTOM PROCEDURE TRAY) ×3 IMPLANT
KIT TURNOVER KIT B (KITS) ×3 IMPLANT
KWIRE 1.6X150 (WIRE) ×4 IMPLANT
NDL SUT 6 .5 CRC .975X.05 MAYO (NEEDLE) ×1 IMPLANT
NEEDLE MAYO TAPER (NEEDLE) ×2
NS IRRIG 1000ML POUR BTL (IV SOLUTION) ×3 IMPLANT
PACK TOTAL JOINT (CUSTOM PROCEDURE TRAY) ×3 IMPLANT
PAD ARMBOARD 7.5X6 YLW CONV (MISCELLANEOUS) ×6 IMPLANT
PAD CAST 4YDX4 CTTN HI CHSV (CAST SUPPLIES) ×2 IMPLANT
PADDING CAST COTTON 4X4 STRL (CAST SUPPLIES) ×4
PADDING CAST COTTON 6X4 STRL (CAST SUPPLIES) ×3 IMPLANT
PLATE 10H PROX TIBIA 3.5X177 (Plate) ×3 IMPLANT
SCREW CORTEX 3.5 30MM (Screw) ×4 IMPLANT
SCREW CORTEX 3.5 32MM (Screw) ×2 IMPLANT
SCREW HEADED ST 3.5X75 (Screw) ×3 IMPLANT
SCREW LOCK CORT ST 3.5X30 (Screw) ×2 IMPLANT
SCREW LOCK CORT ST 3.5X32 (Screw) ×1 IMPLANT
SCREW LOCKING 3.5X70MM VA (Screw) ×9 IMPLANT
SCREW VA-LOCKING 65MM 3.5 (Screw) ×6 IMPLANT
STAPLER VISISTAT 35W (STAPLE) ×3 IMPLANT
SUCTION FRAZIER HANDLE 10FR (MISCELLANEOUS) ×2
SUCTION TUBE FRAZIER 10FR DISP (MISCELLANEOUS) ×1 IMPLANT
SUT ETHILON 2 0 FS 18 (SUTURE) ×3 IMPLANT
SUT ETHILON 3 0 PS 1 (SUTURE) ×6 IMPLANT
SUT FIBERWIRE #2 38 T-5 BLUE (SUTURE)
SUT VIC AB 0 CT1 27 (SUTURE)
SUT VIC AB 0 CT1 27XBRD ANBCTR (SUTURE) IMPLANT
SUT VIC AB 1 CT1 18XCR BRD 8 (SUTURE) IMPLANT
SUT VIC AB 1 CT1 27 (SUTURE) ×4
SUT VIC AB 1 CT1 27XBRD ANBCTR (SUTURE) ×2 IMPLANT
SUT VIC AB 1 CT1 8-18 (SUTURE)
SUT VIC AB 2-0 CT1 27 (SUTURE) ×4
SUT VIC AB 2-0 CT1 TAPERPNT 27 (SUTURE) ×2 IMPLANT
SUTURE FIBERWR #2 38 T-5 BLUE (SUTURE) IMPLANT
TOWEL OR 17X26 10 PK STRL BLUE (TOWEL DISPOSABLE) ×6 IMPLANT
TRAY FOLEY MTR SLVR 16FR STAT (SET/KITS/TRAYS/PACK) IMPLANT
WATER STERILE IRR 1000ML POUR (IV SOLUTION) ×6 IMPLANT

## 2018-06-30 NOTE — Transfer of Care (Signed)
Immediate Anesthesia Transfer of Care Note  Patient: Theresa Dean  Procedure(s) Performed: OPEN REDUCTION INTERNAL FIXATION (ORIF) TIBIAL PLATEAU (Right Leg Lower)  Patient Location: PACU  Anesthesia Type:General  Level of Consciousness: awake, alert  and oriented  Airway & Oxygen Therapy: Patient Spontanous Breathing and Patient connected to nasal cannula oxygen  Post-op Assessment: Report given to RN and Post -op Vital signs reviewed and stable  Post vital signs: Reviewed and stable  Last Vitals:  Vitals Value Taken Time  BP 153/77 06/30/18 1600  Temp    Pulse 75 06/30/18 1603  Resp 9 06/30/18 1603  SpO2 100 % 06/30/18 1603  Vitals shown include unvalidated device data.  Last Pain:  Vitals:   06/30/18 0737  TempSrc:   PainSc: Asleep         Complications: No apparent anesthesia complications

## 2018-06-30 NOTE — H&P (Signed)
Orthopaedic Trauma Service (OTS) Consult   Patient ID: Theresa Dean MRN: [id number] DOB/AGE: 65-Mar-1957 65 y.o.  Reason for Consult:Right tibial plateau fracture Referring Physician: Dr. Ophelia Charter, MD Raliegh Ip  HPI: Theresa Dean is an 63 y.o. female who is being seen in consultation at the request of Dr. Griffin Basil for evaluation of right tibial plateau fracture. The patient was at home and fell down several steps and had immediate pain and deformity. She presented to to ER where x-rays showed a displaced bicondylar tibial plateau fracture. Dr. Griffin Basil was consulted and felt it was outside his scope of practice and recommended treatment by an orthopaedic traumatologist.   Patient is ambulatory without assistive device. She is active and lives alone. Currently not working due to [employer]. Currently having severe pain in leg but is decent when she doesn't move the leg. When she moves the pain is severe. She denies numbness and tingling.   History reviewed. No pertinent past medical history.  Past Surgical History:  Procedure Laterality Date  . ABDOMINAL HYSTERECTOMY    . APPENDECTOMY      No family history on file.  Social History:  reports that she has never smoked. She has never used smokeless tobacco. She reports current alcohol use. She reports that she does not use drugs.  Allergies: No Known Allergies  Medications:  No current facility-administered medications on file prior to encounter.    No current outpatient medications on file prior to encounter.    ROS: Constitutional: No fever or chills Vision: No changes in vision ENT: No difficulty swallowing CV: No chest pain Pulm: No SOB or wheezing GI: No nausea or vomiting GU: No urgency or inability to hold urine Skin: No poor wound healing Neurologic: No numbness or tingling Psychiatric: No depression or anxiety Heme: No bruising Allergic: No reaction to medications or food   Exam: Blood pressure 129/82, pulse 86,  temperature 98.6 F (37 C), temperature source Oral, resp. rate 16, height 5\' 4"  (1.626 m), weight 62.6 kg, SpO2 98 %. General:No acute distress Orientation:Awake, alert and oriented x3 Mood and Affect: Cooperative and pleasant Gait: Unable to assess due to fracture Coordination and balance: Within normal limits  Right lower extremity: Moderate swelling. Compartments soft and compressible. Sensation intact dorsum and plantar aspect of the foot. Warm and well perfused foot with 2+DP pulse. Active dorsiflexion and plantarflexion. No pain with passive stretch. No skin lesions. No deformity about right ankle or right thigh or hip.  Left lower extremity: Skin without lesions. No tenderness to palpation. Full painless ROM, full strength in each muscle groups without evidence of instability.   Medical Decision Making: Imaging: X-rays and CT scan shows bicondylar tibial plateau fracture with involvement of lateral and medial condyles with significant metaphyseal comminution  Labs:  Results for orders placed or performed during the hospital encounter of 06/29/18 (from the past 24 hour(s))  Basic metabolic panel     Status: Abnormal   Collection Time: 06/29/18  8:38 PM  Result Value Ref Range   Sodium 142 135 - 145 mmol/L   Potassium 3.6 3.5 - 5.1 mmol/L   Chloride 107 98 - 111 mmol/L   CO2 24 22 - 32 mmol/L   Glucose, Bld 157 (H) 70 - 99 mg/dL   BUN 19 8 - 23 mg/dL   Creatinine, Ser 0.79 0.44 - 1.00 mg/dL   Calcium 9.2 8.9 - 10.3 mg/dL   GFR calc non Af Amer >60 >60 mL/min   GFR calc Af Amer >60 >  60 mL/min   Anion gap 11 5 - 15  CBC with Differential     Status: Abnormal   Collection Time: 06/29/18  8:38 PM  Result Value Ref Range   WBC 12.9 (H) 4.0 - 10.5 K/uL   RBC 3.93 3.87 - 5.11 MIL/uL   Hemoglobin 12.0 12.0 - 15.0 g/dL   HCT 16.136.9 09.636.0 - 04.546.0 %   MCV 93.9 80.0 - 100.0 fL   MCH 30.5 26.0 - 34.0 pg   MCHC 32.5 30.0 - 36.0 g/dL   RDW 40.912.6 81.111.5 - 91.415.5 %   Platelets 208 150 - 400  K/uL   nRBC 0.0 0.0 - 0.2 %   Neutrophils Relative % 90 %   Neutro Abs 11.6 (H) 1.7 - 7.7 K/uL   Lymphocytes Relative 5 %   Lymphs Abs 0.6 (L) 0.7 - 4.0 K/uL   Monocytes Relative 5 %   Monocytes Absolute 0.6 0.1 - 1.0 K/uL   Eosinophils Relative 0 %   Eosinophils Absolute 0.0 0.0 - 0.5 K/uL   Basophils Relative 0 %   Basophils Absolute 0.0 0.0 - 0.1 K/uL   Immature Granulocytes 0 %   Abs Immature Granulocytes 0.05 0.00 - 0.07 K/uL  SARS Coronavirus 2 (CEPHEID - Performed in [facility] hospital lab), Hosp Order     Status: None   Collection Time: 06/29/18  8:51 PM   Specimen: Nasopharyngeal Swab  Result Value Ref Range   SARS Coronavirus 2 NEGATIVE NEGATIVE  Surgical pcr screen     Status: None   Collection Time: 06/29/18 10:40 PM   Specimen: Nasal Mucosa; Nasal Swab  Result Value Ref Range   MRSA, PCR NEGATIVE NEGATIVE   Staphylococcus aureus NEGATIVE NEGATIVE  Protime-INR     Status: None   Collection Time: 06/29/18 11:53 PM  Result Value Ref Range   Prothrombin Time 13.8 11.4 - 15.2 seconds   INR 1.1 0.8 - 1.2   Medical history and chart was reviewed  Assessment/Plan: 63 year old female with bicondylar tibial plateau fracture  Recommend proceeding with ORIF of tibial plateau today. Risks and benefits discussed. Risks included but not limited to bleeding, infection, malunion, nonunion, arthritis, stiffness, nerve and blood vessel injury, DVT, compartment syndrome and risks associated with general anesthestic. She agrees to proceed. Continue NPO.  Roby LoftsKevin P. Reannah Totten, MD [facility] [phone] (phone) [phone] (office) [email or web address]

## 2018-06-30 NOTE — Op Note (Signed)
Orthopaedic Surgery Operative Note (CSN: [id number] ) Date of Surgery: 06/30/2018  Admit Date: 06/29/2018   Diagnoses: Pre-Op Diagnoses: Right bicondylar tibial plateau with shaft extension  Post-Op Diagnosis: Same  Procedures: 1. CPT 27536-Open reduction internal fixation of right tibial plateau fracture 2. CPT 27758-Open reduction internal fixation of right tibial shaft  Surgeons : Primary: Roby LoftsHaddix, Mark Hassey P, MD  Assistant: Ulyses SouthwardSarah Yacobi, PA-C  Location: OR 3   Anesthesia: General  Antibiotics: Ancef 2g preop   Tourniquet time: Total Tourniquet Time Documented: Thigh (Right) - 78 minutes Total: Thigh (Right) - 78 minutes  Estimated Blood Loss:25 mL  Complications:None   Specimens:None  Implants: Implant Name Type Inv. Item Serial No. Manufacturer Lot No. LRB No. Used Action  PLATE [id number] PROX TIBIA 3.5X177 - [id number] Plate PLATE [id number] PROX TIBIA 3.5X177  [employer]  Right 1 Implanted  SCREW CORTEX 3.5 30MM - [id number] Screw SCREW CORTEX 3.5 30MM  [employer]  Right 2 Implanted  SCREW CORTEX 3.5 32MM - [id number] Screw SCREW CORTEX 3.5 32MM  [employer]  Right 1 Implanted  SCREW HEADED ST 3.5X75 - [id number] Screw SCREW HEADED ST 3.5X75  [employer]  Right 1 Implanted  SCREW LOCKING 3.5X70MM VA - [id number] Screw SCREW LOCKING 3.5X70MM VA  [employer]  Right 3 Implanted  SCREW VA-LOCKING 65MM 3.5 - [id number] Screw SCREW VA-LOCKING 65MM 3.5  [employer]  Right 2 Implanted     Indications for Surgery: 63 year old female with bicondylar tibial plateau fracture with shaft extension after fall from stairs. Due to unstable nature of injury I recommend proceeding with ORIF of tibial plateau. Risks and benefits discussed. Risks included but not limited to bleeding, infection, malunion, nonunion, arthritis, stiffness, nerve and blood vessel injury, DVT, compartment syndrome and risks associated with general anesthestic. She agrees to proceed and consent was  obtained.  Operative Findings: Open reduction internal fixation of right bicondylar tibial plateau fracture and tibial shaft fracture using Synthes 10 hole proximal tibial 3.5 mm LCP VA locking plate  Procedure: The patient was identified in the preoperative holding area. Consent was confirmed with the patient and their family and all questions were answered. The operative extremity was marked after confirmation with the patient. she was then brought back to the operating room by our anesthesia colleagues.  She was placed under general anesthetic and carefully transferred over to a radiolucent flat top table.  A bump was placed under her operative hip.  A nonsterile tourniquet was placed to the upper thigh. The operative extremity was then prepped and draped in usual sterile fashion. A preoperative timeout was performed to verify the patient, the procedure, and the extremity. Preoperative antibiotics were dosed.  Fluoroscopic images were obtained to show the amount of displacement and unstable nature of the injury.  A standard anterior lateral parapatellar incision was then made.  Was carried down through skin and subcutaneous tissue.  Identified the IT band reflected this off Gerdy's tubercle.  I released the IT band off the proximal tibia subperiosteally until I could palpate the fibular head.  I released proximally staying extracapsular.  I performed a slight release of the anterior compartment fascia.  A standard sub-meniscal arthrotomy was made with a 15 blade.  There was no meniscus tear identified.  I then tagged the capsule and meniscus with a #1 Vicryl suture for later repair through the plate.  From my preoperative planning from the CT scan there was a posterior lateral impaction that I was able to elevate and  held provisionally with 1.6 mm K wires directed from anterior to posterior.  I then turned my attention to the medial side.  There is a posterior medial split however was relatively  nondisplaced and I felt that a separate approach was not necessary especially with her poor bone quality.  A percutaneous incision was made and a Soil scientist was used to manipulate the posterior medial fragment and elevate it to get it reduced using fluoroscopy.  Anterior to posterior K wires were used to provisionally hold this reduction.  A 10 hole Synthes VA 3.5 mm LCP proximal tibial locking plate was slid submuscularly along the lateral cortex of the tibia.  A reduction maneuver was made for the tibial shaft fracture and held by my assistant.  A 3.5 mm nonlocking screw was placed through the proximal portion of the plate across both the lateral and medial condyles bringing the proximal portion of the plate flush to bone.  Percutaneous incisions were then made and 3.5 mm nonlocking screws were placed in the tibial shaft to bring the plate flush to bone and to hold the reduction of the tibial shaft component.  I then returned to the proximal portion of the plate and placed locking screws into the proximal row of the plate directing them in the posterior medial fragment.  I then placed 2 more locking screws in the metaphyseal component to reinforce the fixation.  I decided to leave a long working length of the metaphyseal fracture and the shaft fracture to improve biology and secondary bone healing.  The K wires were then removed and final fluoroscopic images were obtained.  The incision was copiously irrigated.  A free needle was used to pass the capsular tag stitches through the plate and this was tied down.  A gram of vancomycin powder was placed into the incision.  The IT band was closed with #1 Vicryl suture.  The skin was closed with a 2-0 Vicryl and 3-0 nylon.  Sterile dressing consisting of bacitracin ointment, Adaptic, 4 x 4's and sterile cast padding was placed.  Patient was placed back into a knee immobilizer.  She was awoken from anesthesia and taken the PACU in stable condition.  Post Op  Plan/Instructions: Patient will be nonweightbearing to the right lower extremity.  She will receive postoperative Ancef.  She will receive Lovenox for DVT prophylaxis.  She will be transition to a hinged knee brace to allow unrestricted range of motion.  The knee brace should be locked in extension at night to decrease the chance of the flexion contracture.  She will mobilize with physical therapy.  I was present and performed the entire surgery.  Patrecia Pace, PA-C did assist me throughout the case. An assistant was necessary given the difficulty in approach, maintenance of reduction and ability to instrument the fracture.   Katha Hamming, MD [facility]

## 2018-06-30 NOTE — Progress Notes (Signed)
Orthopedic Tech Progress Note Patient Details:  Theresa Dean 01-20-1955 [id number]  Ortho Devices Type of Ortho Device: Knee Immobilizer Ortho Device/Splint Location: rle. applied with slight bend in the knee. Ortho Device/Splint Interventions: Ordered, Application, Adjustment   Post Interventions Patient Tolerated: Well Instructions Provided: Care of device, Adjustment of device   Karolee Stamps 06/30/2018, 2:47 AM

## 2018-06-30 NOTE — Anesthesia Procedure Notes (Signed)
Procedure Name: Intubation Date/Time: 06/30/2018 2:00 PM Performed by: Clearnce Sorrel, CRNA Pre-anesthesia Checklist: Patient identified, Emergency Drugs available, Suction available, Patient being monitored and Timeout performed Patient Re-evaluated:Patient Re-evaluated prior to induction Oxygen Delivery Method: Circle system utilized Preoxygenation: Pre-oxygenation with 100% oxygen Induction Type: IV induction Ventilation: Mask ventilation without difficulty Laryngoscope Size: Mac and 3 Grade View: Grade II Tube type: Oral Tube size: 7.0 mm Number of attempts: 1 Airway Equipment and Method: Stylet Placement Confirmation: positive ETCO2,  breath sounds checked- equal and bilateral and ETT inserted through vocal cords under direct vision Secured at: 22 cm Tube secured with: Tape Dental Injury: Teeth and Oropharynx as per pre-operative assessment

## 2018-06-30 NOTE — Anesthesia Preprocedure Evaluation (Signed)
Anesthesia Evaluation  Patient identified by MRN, date of birth, ID band Patient awake    Reviewed: Allergy & Precautions, NPO status , Patient's Chart, lab work & pertinent test results  Airway Mallampati: II  TM Distance: >3 FB Neck ROM: Full    Dental no notable dental hx. (+) Dental Advisory Given   Pulmonary neg pulmonary ROS,    Pulmonary exam normal breath sounds clear to auscultation       Cardiovascular negative cardio ROS Normal cardiovascular exam Rhythm:Regular Rate:Normal     Neuro/Psych negative neurological ROS  negative psych ROS   GI/Hepatic negative GI ROS, Neg liver ROS,   Endo/Other  negative endocrine ROS  Renal/GU negative Renal ROS     Musculoskeletal negative musculoskeletal ROS (+)   Abdominal   Peds  Hematology negative hematology ROS (+)   Anesthesia Other Findings   Reproductive/Obstetrics negative OB ROS                             Anesthesia Physical Anesthesia Plan  ASA: II  Anesthesia Plan: General   Post-op Pain Management:    Induction: Intravenous  PONV Risk Score and Plan: 4 or greater and Ondansetron, Dexamethasone, Midazolam and Treatment may vary due to age or medical condition  Airway Management Planned: LMA and Oral ETT  Additional Equipment:   Intra-op Plan:   Post-operative Plan: Extubation in OR  Informed Consent: I have reviewed the patients History and Physical, chart, labs and discussed the procedure including the risks, benefits and alternatives for the proposed anesthesia with the patient or authorized representative who has indicated his/her understanding and acceptance.     Dental advisory given  Plan Discussed with: CRNA  Anesthesia Plan Comments:         Anesthesia Quick Evaluation

## 2018-06-30 NOTE — Progress Notes (Addendum)
Pt's daughter Caryl Pina updated at [phone] that pt had returned back to room. Pt resting comfortably at this time.  Ortho tech said hinge brace will be delivered tomorrow.

## 2018-06-30 NOTE — Progress Notes (Signed)
Updated pt's daughter Caryl Pina about plan for surgery. RN will call daughter when pt returns to floor after surgery.

## 2018-07-01 ENCOUNTER — Encounter (HOSPITAL_COMMUNITY): Payer: Self-pay | Admitting: Student

## 2018-07-01 DIAGNOSIS — S82451A Displaced comminuted fracture of shaft of right fibula, initial encounter for closed fracture: Secondary | ICD-10-CM | POA: Diagnosis present

## 2018-07-01 DIAGNOSIS — M79604 Pain in right leg: Secondary | ICD-10-CM | POA: Diagnosis present

## 2018-07-01 DIAGNOSIS — S82201A Unspecified fracture of shaft of right tibia, initial encounter for closed fracture: Secondary | ICD-10-CM | POA: Diagnosis present

## 2018-07-01 DIAGNOSIS — Z90710 Acquired absence of both cervix and uterus: Secondary | ICD-10-CM | POA: Diagnosis not present

## 2018-07-01 DIAGNOSIS — Z1159 Encounter for screening for other viral diseases: Secondary | ICD-10-CM | POA: Diagnosis not present

## 2018-07-01 DIAGNOSIS — D62 Acute posthemorrhagic anemia: Secondary | ICD-10-CM | POA: Diagnosis not present

## 2018-07-01 DIAGNOSIS — S82141A Displaced bicondylar fracture of right tibia, initial encounter for closed fracture: Secondary | ICD-10-CM | POA: Diagnosis present

## 2018-07-01 DIAGNOSIS — Y92009 Unspecified place in unspecified non-institutional (private) residence as the place of occurrence of the external cause: Secondary | ICD-10-CM | POA: Diagnosis not present

## 2018-07-01 DIAGNOSIS — I959 Hypotension, unspecified: Secondary | ICD-10-CM | POA: Diagnosis not present

## 2018-07-01 DIAGNOSIS — W109XXA Fall (on) (from) unspecified stairs and steps, initial encounter: Secondary | ICD-10-CM | POA: Diagnosis present

## 2018-07-01 LAB — CBC
HCT: 27.8 % — ABNORMAL LOW (ref 36.0–46.0)
Hemoglobin: 9.3 g/dL — ABNORMAL LOW (ref 12.0–15.0)
MCH: 31.1 pg (ref 26.0–34.0)
MCHC: 33.5 g/dL (ref 30.0–36.0)
MCV: 93 fL (ref 80.0–100.0)
Platelets: 194 10*3/uL (ref 150–400)
RBC: 2.99 MIL/uL — ABNORMAL LOW (ref 3.87–5.11)
RDW: 13 % (ref 11.5–15.5)
WBC: 10.5 10*3/uL (ref 4.0–10.5)
nRBC: 0 % (ref 0.0–0.2)

## 2018-07-01 LAB — VITAMIN D 25 HYDROXY (VIT D DEFICIENCY, FRACTURES): Vit D, 25-Hydroxy: 44.5 ng/mL (ref 30.0–100.0)

## 2018-07-01 MED ORDER — VITAMIN D 25 MCG (1000 UNIT) PO TABS
2000.0000 [IU] | ORAL_TABLET | Freq: Two times a day (BID) | ORAL | Status: DC
  Administered 2018-07-01 – 2018-07-02 (×3): 2000 [IU] via ORAL
  Filled 2018-07-01 (×3): qty 2

## 2018-07-01 MED ORDER — SODIUM CHLORIDE 0.9 % IV SOLN
INTRAVENOUS | Status: DC
  Administered 2018-07-01: 12:00:00 via INTRAVENOUS

## 2018-07-01 NOTE — Evaluation (Signed)
Physical Therapy Evaluation Patient Details Name: Theresa Dean MRN: [id number] DOB: Jan 08, 1955 Today's Date: 07/01/2018   History of Present Illness  Patient is a 63 year old female admitted after falling down stairs at home. Sustained Tib/fib fracture and is s/p ORIF. NWB on right LE. Patient has locking brace on right LE that can be left unlocked during the day, locked in extension at night. Per MD order.  Clinical Impression  Patient received in bed with brace being donned by [employer] rep. Patient agrees to PT and getting OOB. Patient reports moderate pain with movement of right LE. BP continues to be low, monitored throughout session for s/s. Patient had slight dizziness upon sitting up in bed, resolved with time. Patient requires min assist with bed mobility and transfer STS. Patient requires cues for safety with transfers and cues to maintain NWB status on right when standing. Patient ambulated 5 feet to recliner using RW and min guard assist. Patient then was assisted to [facility] by pivoting from recliner. Limited by pain with mobility. Patient will benefit from continued skilled PT to address her decreased safety with mobility, decreased mobility and pain.         Follow Up Recommendations Home health PT    Equipment Recommendations  Rolling walker with 5" wheels;3in1 (PT)    Recommendations for Other Services       Precautions / Restrictions Precautions Required Braces or Orthoses: Other Brace Other Brace: locking brace on right LE Restrictions Weight Bearing Restrictions: Yes RLE Weight Bearing: Non weight bearing      Mobility  Bed Mobility Overal bed mobility: Needs Assistance Bed Mobility: Supine to Sit     Supine to sit: Min assist     General bed mobility comments: min assist to bring right LE off bed.  Transfers Overall transfer level: Modified independent Equipment used: Rolling walker (2 wheeled)                Ambulation/Gait Ambulation/Gait  assistance: Min guard Gait Distance (Feet): 5 Feet Assistive device: Rolling walker (2 wheeled) Gait Pattern/deviations: Step-to pattern;Decreased step length - left Gait velocity: decreased   General Gait Details: requires cues to maintain NWB on right  Stairs            Wheelchair Mobility    Modified Rankin (Stroke Patients Only)       Balance Overall balance assessment: Needs assistance Sitting-balance support: Bilateral upper extremity supported;Feet supported Sitting balance-Leahy Scale: Fair     Standing balance support: Bilateral upper extremity supported Standing balance-Leahy Scale: Fair                               Pertinent Vitals/Pain Pain Assessment: Faces Faces Pain Scale: Hurts whole lot Pain Descriptors / Indicators: Aching;Grimacing;Operative site guarding Pain Intervention(s): Limited activity within patient's tolerance;Monitored during session;Patient requesting pain meds-RN notified;Repositioned;Ice applied    Home Living Family/patient expects to be discharged to:: Private residence Living Arrangements: Children Available Help at Discharge: Family Type of Home: House Home Access: Stairs to enter   Technical brewer of Steps: 1 if she goes to [place]: One [place]: None      Prior Function Level of Independence: Independent               Hand Dominance        Extremity/Trunk Assessment   Upper Extremity Assessment Upper Extremity Assessment: Overall WFL for tasks assessed    Lower  Extremity Assessment Lower Extremity Assessment: Overall WFL for tasks assessed    Cervical / Trunk Assessment Cervical / Trunk Assessment: Normal  Communication   Communication: No difficulties  Cognition Arousal/Alertness: Awake/alert Behavior During Therapy: WFL for tasks assessed/performed Overall Cognitive Status: Within Functional Limits for tasks assessed                                         General Comments      Exercises Total Joint Exercises Ankle Circles/Pumps: AROM;Both;Supine;10 reps   Assessment/Plan    PT Assessment Patient needs continued PT services  PT Problem List Decreased mobility;Decreased strength;Decreased coordination;Decreased knowledge of precautions;Decreased activity tolerance;Decreased balance;Decreased knowledge of use of DME;Pain       PT Treatment Interventions DME instruction;Therapeutic activities;Gait training;Therapeutic exercise;Patient/family education;Stair training;Balance training;Functional mobility training    PT Goals (Current goals can be found in the Care Plan section)  Acute Rehab PT Goals Patient Stated Goal: to return home with daughter's help PT Goal Formulation: With patient Time For Goal Achievement: 07/08/18 Potential to Achieve Goals: Good    Frequency Min 5X/week   Barriers to discharge        Co-evaluation               AM-PAC PT "6 Clicks" Mobility  Outcome Measure Help needed turning from your back to your side while in a flat bed without using bedrails?: A Lot Help needed moving from lying on your back to sitting on the side of a flat bed without using bedrails?: A Lot Help needed moving to and from a bed to a chair (including a wheelchair)?: A Lot Help needed standing up from a chair using your arms (e.g., wheelchair or bedside chair)?: A Little Help needed to walk in hospital room?: A Little Help needed climbing 3-5 steps with a railing? : A Lot 6 Click Score: 14    End of Session Equipment Utilized During Treatment: Gait belt;Other (comment)(R LE brace) Activity Tolerance: Patient tolerated treatment well;Patient limited by pain Patient left: in chair;with call bell/phone within reach Nurse Communication: Mobility status PT Visit Diagnosis: Unsteadiness on feet (R26.81);Other abnormalities of gait and mobility (R26.89);Muscle weakness (generalized) (M62.81);History of  falling (Z91.81);Pain Pain - Right/Left: Right Pain - part of body: Leg    Time: 1100-1143 PT Time Calculation (min) (ACUTE ONLY): 43 min   Charges:   PT Evaluation $PT Eval Moderate Complexity: 1 Mod PT Treatments $Gait Training: 8-22 mins $Therapeutic Activity: 8-22 mins        Sumit Branham, PT, GCS 07/01/18,12:01 PM

## 2018-07-01 NOTE — Anesthesia Postprocedure Evaluation (Signed)
Anesthesia Post Note  Patient: Theresa Dean  Procedure(s) Performed: OPEN REDUCTION INTERNAL FIXATION (ORIF) TIBIAL PLATEAU (Right Leg Lower)     Patient location during evaluation: PACU Anesthesia Type: General Level of consciousness: awake and alert Pain management: pain level controlled Vital Signs Assessment: post-procedure vital signs reviewed and stable Respiratory status: spontaneous breathing, nonlabored ventilation, respiratory function stable and patient connected to nasal cannula oxygen Cardiovascular status: blood pressure returned to baseline and stable Postop Assessment: no apparent nausea or vomiting Anesthetic complications: no    Last Vitals:  Vitals:   07/01/18 0018 07/01/18 0359  BP: 90/65 (!) 89/53  Pulse: 80 76  Resp:  16  Temp:  36.8 C  SpO2:  93%    Last Pain:  Vitals:   07/01/18 0730  TempSrc:   PainSc: 6    Pain Goal: Patients Stated Pain Goal: 2 (06/30/18 1741)                 Addaline Peplinski L Wahid Holley

## 2018-07-01 NOTE — Progress Notes (Signed)
Orthopaedic Trauma Progress Note  S: Doing well this AM, pain well controlled. A bit hypotensive. Denies any lightheadedness, SOB. No questions or concerns. Plan to work with PT today, she is hopeful to go home tomorrow. Checked patient's vitamin D level post-operatively yesterday, although not deficient I have started patient on vitamin D supplement to help with fracture healing.  Patient has no steps to get inside her front door at home. She plans to go back to her house at discharge and states she has 2 daughters that live in town that could come stay with her to help if needed.   O:  Vitals:   07/01/18 0018 07/01/18 0359  BP: 90/65 (!) 89/53  Pulse: 80 76  Resp:  16  Temp:  98.2 F (36.8 C)  SpO2:  93%    General - Sitting up in bed, NAD. Alert and oriented x 3 Cardiac - Heart regular rate and rhythm Respiratory -  No increased work of breathing. Lungs CTA anterior lung fields bilaterally Right Lower Extremity - Knee immobilizer in place. Dressing c/d/i. Moderate tenderness to palpation of knee. Less tender in thigh and lower leg. Able to get a small amount of knee flexion.  Dorsiflexion/plantarflexion intact. Compartments soft and compressible. Sensation intact to light touch of extremity. +EHL. 2+ DP pulse  Imaging: Stable post op imaging  Labs:  Results for orders placed or performed during the hospital encounter of 06/29/18 (from the past 24 hour(s))  CBC     Status: Abnormal   Collection Time: 07/01/18  3:53 AM  Result Value Ref Range   WBC 10.5 4.0 - 10.5 K/uL   RBC 2.99 (L) 3.87 - 5.11 MIL/uL   Hemoglobin 9.3 (L) 12.0 - 15.0 g/dL   HCT 27.8 (L) 36.0 - 46.0 %   MCV 93.0 80.0 - 100.0 fL   MCH 31.1 26.0 - 34.0 pg   MCHC 33.5 30.0 - 36.0 g/dL   RDW 13.0 11.5 - 15.5 %   Platelets 194 150 - 400 K/uL   nRBC 0.0 0.0 - 0.2 %    Assessment: 63 year old female s/p fall  Injuries: Right tibial plateau fracture s/p ORIF 06/30/18  Weightbearing: NWB RLE  Insicional and  dressing care: dressing c/d/i. Will change tomorrow  Orthopedic device(s): Hinge knee brace being delivered today.  Okay for brace to be unlocked during the day for knee ROM. Lock in full extension at night  CV/Blood loss: Acute blood loss anemia, Hgb 9.3 this AM. A bit hypotensive. Will continue to monitor CBC  Pain management:  1. Tylenol 1000 mg q 8 hours scheduled 2. Robaxin 500 mg q 6 hours PRN 3. Oxycodone 5-10 mg q 4 hours PRN 4. Neurontin 100 mg TID 5. Dilaudid 0.5-1 mg q 3 hours PRN  VTE prophylaxis: Lovenox starting today  ID: Ancef 2gm post op  Foley/Lines: No foley, KVO IVFs   Medical co-morbidities: None noted  Dispo:  PT eval today, dispo pending  Follow - up plan: TBD    Carston Riedl A. Carmie Kanner [facility] ?[phone]? (phone)

## 2018-07-01 NOTE — Progress Notes (Signed)
Orthopedic Tech Progress Note Patient Details:  Theresa Dean 11-21-1955 [id number] Called in order to [facility] for a [place]. Patient ID: Theresa Dean, female   DOB: [birth year], 63 y.o.   MRN: [id number]   [name] 07/01/2018, 8:34 AM

## 2018-07-01 NOTE — Plan of Care (Signed)
  Problem: Education: Goal: Knowledge of General Education information will improve Description: Including pain rating scale, medication(s)/side effects and non-pharmacologic comfort measures Outcome: Progressing   Problem: Pain Managment: Goal: General experience of comfort will improve Outcome: Progressing   Problem: Safety: Goal: Ability to remain free from injury will improve Outcome: Progressing   

## 2018-07-01 NOTE — Discharge Instructions (Addendum)
[facility] Discharge Instructions   General Discharge Instructions  WEIGHT BEARING STATUS: Non-weightbearing on right leg  RANGE OF MOTION/ACTIVITY: Wear hinged knee brace at all times. Okay to unlock brace and allow unrestricted range of motion while awake.  The knee brace should be locked in extension at night  Wound Care: Incisions can be left open to air if there is no drainage. If incision continues to have drainage, follow wound care instructions below. Okay to shower if no drainage from incisions.  DVT/PE prophylaxis: Aspirin 325 mg twice daily  Diet: as you were eating previously.  Can use over the counter stool softeners and bowel preparations, such as Miralax, to help with bowel movements.  Narcotics can be constipating.  Be sure to drink plenty of fluids  PAIN MEDICATION USE AND EXPECTATIONS  You have likely been given narcotic medications to help control your pain.  After a traumatic event that results in an fracture (broken bone) with or without surgery, it is ok to use narcotic pain medications to help control one's pain.  We understand that everyone responds to pain differently and each individual patient will be evaluated on a regular basis for the continued need for narcotic medications. Ideally, narcotic medication use should last no more than 6-8 weeks (coinciding with fracture healing).   As a patient it is your responsibility as well to monitor narcotic medication use and report the amount and frequency you use these medications when you come to your office visit.   We would also advise that if you are using narcotic medications, you should take a dose prior to therapy to maximize you participation.  IF YOU ARE ON NARCOTIC MEDICATIONS IT IS NOT PERMISSIBLE TO OPERATE A MOTOR VEHICLE (MOTORCYCLE/CAR/TRUCK/MOPED) OR HEAVY MACHINERY DO NOT MIX NARCOTICS WITH OTHER CNS (CENTRAL NERVOUS SYSTEM) DEPRESSANTS SUCH AS ALCOHOL   STOP SMOKING OR USING NICOTINE  PRODUCTS!!!!  As discussed nicotine severely impairs your body's ability to heal surgical and traumatic wounds but also impairs bone healing.  Wounds and bone heal by forming microscopic blood vessels (angiogenesis) and nicotine is a vasoconstrictor (essentially, shrinks blood vessels).  Therefore, if vasoconstriction occurs to these microscopic blood vessels they essentially disappear and are unable to deliver necessary nutrients to the healing tissue.  This is one modifiable factor that you can do to dramatically increase your chances of healing your injury.    (This means no smoking, no nicotine gum, patches, etc)  DO NOT USE NONSTEROIDAL ANTI-INFLAMMATORY DRUGS (NSAID'S)  Using products such as Advil (ibuprofen), Aleve (naproxen), Motrin (ibuprofen) for additional pain control during fracture healing can delay and/or prevent the healing response.  If you would like to take over the counter (OTC) medication, Tylenol (acetaminophen) is ok.  However, some narcotic medications that are given for pain control contain acetaminophen as well. Therefore, you should not exceed more than 4000 mg of tylenol in a day if you do not have liver disease.  Also note that there are may OTC medicines, such as cold medicines and allergy medicines that my contain tylenol as well.  If you have any questions about medications and/or interactions please ask your doctor/PA or your pharmacist.      ICE AND ELEVATE INJURED/OPERATIVE EXTREMITY  Using ice and elevating the injured extremity above your heart can help with swelling and pain control.  Icing in a pulsatile fashion, such as 20 minutes on and 20 minutes off, can be followed.    Do not place ice directly on skin.  Make sure there is a barrier between to skin and the ice pack.    Using frozen items such as frozen peas works well as the conform nicely to the are that needs to be iced.  USE AN ACE WRAP OR TED HOSE FOR SWELLING CONTROL  In addition to icing and elevation,  Ace wraps or TED hose are used to help limit and resolve swelling.  It is recommended to use Ace wraps or TED hose until you are informed to stop.    When using Ace Wraps start the wrapping distally (farthest away from the body) and wrap proximally (closer to the body)   Example: If you had surgery on your leg or thing and you do not have a splint on, start the ace wrap at the toes and work your way up to the thigh        If you had surgery on your upper extremity and do not have a splint on, start the ace wrap at your fingers and work your way up to the upper arm   CALL THE OFFICE WITH ANY QUESTIONS OR CONCERNS: [phone]   VISIT OUR WEBSITE FOR ADDITIONAL INFORMATION: [email or web address]     Discharge Wound Care Instructions  Do NOT apply any ointments, solutions or lotions to pin sites or surgical wounds.  These prevent needed drainage and even though solutions like hydrogen peroxide kill bacteria, they also damage cells lining the pin sites that help fight infection.  Applying lotions or ointments can keep the wounds moist and can cause them to breakdown and open up as well. This can increase the risk for infection. When in doubt call the office.  Surgical incisions should be dressed daily.  If any drainage is noted, use one layer of adaptic, then gauze, Kerlix, and an ace wrap.  Once the incision is completely dry and without drainage, it may be left open to air out.  Showering may begin 36-48 hours later.  Cleaning gently with soap and water.  Traumatic wounds should be dressed daily as well.    One layer of adaptic, gauze, Kerlix, then ace wrap.  The adaptic can be discontinued once the draining has ceased    If you have a wet to dry dressing: wet the gauze with saline the squeeze as much saline out so the gauze is moist (not soaking wet), place moistened gauze over wound, then place a dry gauze over the moist one, followed by Kerlix wrap, then ace wrap.

## 2018-07-02 ENCOUNTER — Encounter (HOSPITAL_COMMUNITY): Payer: No Typology Code available for payment source

## 2018-07-02 MED ORDER — METHOCARBAMOL 750 MG PO TABS: 750.0000 mg | ORAL_TABLET | Freq: Four times a day (QID) | ORAL | 0 refills | Status: AC | PRN

## 2018-07-02 MED ORDER — GABAPENTIN 100 MG PO CAPS: 100.0000 mg | ORAL_CAPSULE | Freq: Three times a day (TID) | ORAL | 0 refills | Status: AC

## 2018-07-02 MED ORDER — ASPIRIN EC 325 MG PO TBEC: 325.0000 mg | DELAYED_RELEASE_TABLET | Freq: Two times a day (BID) | ORAL | 0 refills | Status: AC

## 2018-07-02 MED ORDER — ACETAMINOPHEN 500 MG PO TABS: 500.0000 mg | ORAL_TABLET | Freq: Two times a day (BID) | ORAL | 1 refills | Status: AC

## 2018-07-02 MED ORDER — OXYCODONE HCL 5 MG PO TABS: 5.0000 mg | ORAL_TABLET | ORAL | 0 refills | Status: AC | PRN

## 2018-07-02 MED ORDER — VITAMIN D 25 MCG (1000 UNIT) PO TABS: 1000.0000 [IU] | ORAL_TABLET | Freq: Two times a day (BID) | ORAL | 1 refills | Status: AC

## 2018-07-02 NOTE — Discharge Summary (Signed)
[facility]thopaedic Trauma Service (OTS) Discharge Summary   Patient ID: Theresa Dean MRN: [id number] DOB/AGE: Mar 07, 1955 63 y.o.  Admit date: 06/29/2018 Discharge date: 07/02/2018  Admission Diagnoses: Right bicondylar tibial plateau fracture   Discharge Diagnoses:  Principal Problem:   Closed bicondylar fracture of right tibial plateau Active Problems:   Right tibial fracture   History reviewed. No pertinent past medical history.   Procedures Performed: ORIF Right tibial plateau  Discharged Condition: good/stable  Hospital Course: [facility]18 after a fall at home. Had immediate pain and deformity of the right leg. Was found to have right bicondylar tibial plateau fracture in the ED. Taken to operating room by Dr. Jena GaussHaddix on 06/30/18 for open reduction internal fixation of the tibial plateau. She tolerated the procedure well. Was made non-weightbearing to the right lower extremity following the procedure. Was transitioned to hinge knee brace post-operatively and allowed unrestricted range of motion of the knee in the brace. Was given Ancef 2gm x 24 hours post-operatively. Started on Lovenox on POD #1 for DVT prophylaxis. Began working with therapy starting on POD #1. Dressing changed on POD #2 and incisions were clean, dry, intact.  On 07/02/2018, the patient was tolerating diet, working well with therapies, pain well controlled, vital signs stable, dressings clean, dry, intact and felt stable for discharge to home with home health PT. Patient will follow up as below and knows to call with questions or concerns.     Consults: None  Significant Diagnostic Studies: None  Results for orders placed or performed during the hospital encounter of 06/29/18 (from the past 168 hour(s))  Basic metabolic panel   Collection Time: 06/29/18  8:38 PM  Result Value Ref Range   Sodium 142 135 - 145 mmol/L   Potassium 3.6 3.5 - 5.1 mmol/L   Chloride 107 98 - 111  mmol/L   CO2 24 22 - 32 mmol/L   Glucose, Bld 157 (H) 70 - 99 mg/dL   BUN 19 8 - 23 mg/dL   Creatinine, Ser 4.090.79 0.44 - 1.00 mg/dL   Calcium 9.2 8.9 - 81.110.3 mg/dL   GFR calc non Af Amer >60 >60 mL/min   GFR calc Af Amer >60 >60 mL/min   Anion gap 11 5 - 15  CBC with Differential   Collection Time: 06/29/18  8:38 PM  Result Value Ref Range   WBC 12.9 (H) 4.0 - 10.5 K/uL   RBC 3.93 3.87 - 5.11 MIL/uL   Hemoglobin 12.0 12.0 - 15.0 g/dL   HCT 91.436.9 78.236.0 - 95.646.0 %   MCV 93.9 80.0 - 100.0 fL   MCH 30.5 26.0 - 34.0 pg   MCHC 32.5 30.0 - 36.0 g/dL   RDW 21.312.6 08.611.5 - 57.815.5 %   Platelets 208 150 - 400 K/uL   nRBC 0.0 0.0 - 0.2 %   Neutrophils Relative % 90 %   Neutro Abs 11.6 (H) 1.7 - 7.7 K/uL   Lymphocytes Relative 5 %   Lymphs Abs 0.6 (L) 0.7 - 4.0 K/uL   Monocytes Relative 5 %   Monocytes Absolute 0.6 0.1 - 1.0 K/uL   Eosinophils Relative 0 %   Eosinophils Absolute 0.0 0.0 - 0.5 K/uL   Basophils Relative 0 %   Basophils Absolute 0.0 0.0 - 0.1 K/uL   Immature Granulocytes 0 %   Abs Immature Granulocytes 0.05 0.00 - 0.07 K/uL  SARS Coronavirus 2 (CEPHEID - Performed in [facility] lab), [facility] Order  Collection Time: 06/29/18  8:51 PM   Specimen: Nasopharyngeal Swab  Result Value Ref Range   SARS Coronavirus 2 NEGATIVE NEGATIVE  Surgical pcr screen   Collection Time: 06/29/18 10:40 PM   Specimen: Nasal Mucosa; Nasal Swab  Result Value Ref Range   MRSA, PCR NEGATIVE NEGATIVE   Staphylococcus aureus NEGATIVE NEGATIVE  HIV antibody (Routine Testing)   Collection Time: 06/29/18 11:53 PM  Result Value Ref Range   HIV Screen 4th Generation wRfx Non Reactive Non Reactive  Protime-INR   Collection Time: 06/29/18 11:53 PM  Result Value Ref Range   Prothrombin Time 13.8 11.4 - 15.2 seconds   INR 1.1 0.8 - 1.2  VITAMIN D 25 Hydroxy (Vit-D Deficiency, Fractures)   Collection Time: 06/30/18  6:19 PM  Result Value Ref Range   Vit D, 25-Hydroxy 44.5 30.0 - 100.0 ng/mL  CBC    Collection Time: 07/01/18  3:53 AM  Result Value Ref Range   WBC 10.5 4.0 - 10.5 K/uL   RBC 2.99 (L) 3.87 - 5.11 MIL/uL   Hemoglobin 9.3 (L) 12.0 - 15.0 g/dL   HCT 16.127.8 (L) 09.636.0 - 04.546.0 %   MCV 93.0 80.0 - 100.0 fL   MCH 31.1 26.0 - 34.0 pg   MCHC 33.5 30.0 - 36.0 g/dL   RDW 40.913.0 81.111.5 - 91.415.5 %   Platelets 194 150 - 400 K/uL   nRBC 0.0 0.0 - 0.2 %     Treatments: surgery: ORIF right tibial plateau  Discharge Exam: General - Sitting up in bed, NAD. Alert and oriented x 3 Cardiac - Heart regular rate and rhythm Respiratory -  No increased work of breathing. Lungs CTA anterior lung fields bilaterally Right Lower Extremity - Hinged knee brace in place. Dressing removed, incsion is clean, dry, intact. Some expected swelling and bruising over lower leg notedModerate tenderness to palpation of knee and with compression of calf. Knee flexion to about 30 degrees. Dorsiflexion/plantarflexion intact. Compartments soft and compressible. Sensation intact to light touch of extremity. +EHL. 2+ DP pulse  Disposition: Home with home health PT   Allergies as of 07/02/2018      Reactions   Morphine Nausea And Vomiting   Other reaction(s): Vomiting (intolerance)   Oxycodone-acetaminophen Nausea And Vomiting   Other reaction(s): Vomiting (intolerance)      Medication List    STOP taking these medications   naproxen sodium 220 MG tablet Commonly known as: ALEVE     TAKE these medications   acetaminophen 500 MG tablet Commonly known as: TYLENOL Take 1 tablet (500 mg total) by mouth every 12 (twelve) hours.   aspirin EC 325 MG tablet Take 1 tablet (325 mg total) by mouth 2 (two) times a day.   cholecalciferol 25 MCG (1000 UT) tablet Commonly known as: VITAMIN D3 Take 1 tablet (1,000 Units total) by mouth 2 (two) times a day.   co-enzyme Q-10 30 MG capsule Take 30 mg by mouth daily.   gabapentin 100 MG capsule Commonly known as: NEURONTIN Take 1 capsule (100 mg total) by mouth 3 (three)  times daily.   methocarbamol 750 MG tablet Commonly known as: ROBAXIN Take 1 tablet (750 mg total) by mouth every 6 (six) hours as needed for muscle spasms.   Omega-3 1000 MG Caps Take 2 capsules by mouth daily.   oxyCODONE 5 MG immediate release tablet Commonly known as: Oxy IR/ROXICODONE Take 1 tablet (5 mg total) by mouth every 4 (four) hours as needed for moderate pain or severe  pain.   Turmeric 500 MG Caps Take 500 mg by mouth daily.   vitamin B-1 250 MG tablet Take 250 mg by mouth daily.            Durable Medical Equipment  (From admission, onward)         Start     Ordered   07/01/18 1247  For home use only DME Walker rolling  Once    Question:  Patient needs a walker to treat with the following condition  Answer:  Closed bicondylar fracture of right tibial plateau   07/01/18 1246   07/01/18 1247  For home use only DME 3 n 1  Once     07/01/18 1246         Follow-up Information    Haddix, Thomasene Lot, MD. Schedule an appointment as soon as possible for a visit in 2 week(s).   Specialty: Orthopedic Surgery Why: For suture removal and repeat x-rays Contact information: [address] [place] [address] [phone]           Discharge Instructions and Plan: Patient will be discharged to home. Will be discharged on Aspirin twice daily for DVT prophylaxis. She will remain non-weightbearing on the right leg. Okay for range of motion of the knee while in the hinge brace but brace should be locked in full extension at night to prevent flexion contracture. Patient has been provided with all the necessary DME for discharge. Patient will follow up with Dr. Doreatha Martin in 2 weeks for repeat x-rays and suture removal.   Signed:  Leary Roca. Carmie Kanner ?[phone]? (phone) 07/02/2018, 8:24 AM  [facility] [place]  [phone] (F)

## 2018-07-02 NOTE — Progress Notes (Signed)
RN spoke with pt's daughter, Caryl Pina, and updated her on pt. Caryl Pina requesting to be notified early if pt discharging today due to her being 2 hours away. All questions answered.

## 2018-07-02 NOTE — Progress Notes (Signed)
Tiffany with [facility] reports they can take patient for [facility] PT, and can set up an appointment with her for next Wednesday July 1.   [place], [place]

## 2018-07-02 NOTE — Progress Notes (Signed)
Physical Therapy Treatment Patient Details Name: Theresa Dean MRN: [id number] DOB: 05-05-1955 Today's Date: 07/02/2018    History of Present Illness Patient is a 63 year old female admitted after falling down stairs at home. Sustained Tib/fib fracture and is s/p ORIF. NWB on right LE. Patient has locking brace on right LE that can be left unlocked during the day, locked in extension at night.    PT Comments    Patient reports she is doing okay, just received pain medication not long ago. Agreeable to PT, wants to go home today.  Patient demonstrating improved bed mobility this visit requiring only support of right LE due to pain. Min guard assist and cues for safety with transfers, hand placement, turning fully and backing up to recliner prior to attempting to sit. Patient ambulated with RW and min guard x 25 feet in room NWB on right, educated on stair training for safe entrance home. Patient will benefit from HHPT to ensure safety with mobility, improve ambulation, improve transfers and strengthening.     Follow Up Recommendations  Home health PT     Equipment Recommendations       Recommendations for Other Services       Precautions / Restrictions Precautions Precautions: Fall Required Braces or Orthoses: Knee Immobilizer - Right(unlocked during day, locked in extension at night) Knee Immobilizer - Right: On at all times Restrictions Weight Bearing Restrictions: Yes RLE Weight Bearing: Non weight bearing    Mobility  Bed Mobility Overal bed mobility: (requires support under R LE but no other assistance) Bed Mobility: Supine to Sit     Supine to sit: Min guard        Transfers Overall transfer level: Modified independent Equipment used: Rolling walker (2 wheeled)             General transfer comment: requires cues for safety and for correct hand placement with transfers  Ambulation/Gait Ambulation/Gait assistance: Min guard Gait Distance (Feet): 25  Feet Assistive device: Rolling walker (2 wheeled) Gait Pattern/deviations: Step-to pattern;Decreased step length - left Gait velocity: decreased   General Gait Details: cues for NWB status on right. Cues for safety   Stairs Stairs: (educated patient on stair training for home entrance)           Wheelchair Mobility    Modified Rankin (Stroke Patients Only)       Balance Overall balance assessment: Needs assistance Sitting-balance support: No upper extremity supported Sitting balance-Leahy Scale: Good   Postural control: Posterior lean Standing balance support: Bilateral upper extremity supported Standing balance-Leahy Scale: Fair                              Cognition Arousal/Alertness: Awake/alert Behavior During Therapy: WFL for tasks assessed/performed Overall Cognitive Status: Within Functional Limits for tasks assessed                                        Exercises Total Joint Exercises Ankle Circles/Pumps: AROM;10 reps;Right Long Arc Quad: AAROM;Right;10 reps;Seated    General Comments        Pertinent Vitals/Pain Pain Assessment: Faces Faces Pain Scale: Hurts a little bit Pain Descriptors / Indicators: Aching;Burning;Discomfort Pain Intervention(s): Limited activity within patient's tolerance;Monitored during session;Repositioned;Premedicated before session    Home Living  Prior Function            PT Goals (current goals can now be found in the care plan section) Acute Rehab PT Goals Patient Stated Goal: to return home with daughter's help PT Goal Formulation: With patient Time For Goal Achievement: 07/08/18 Potential to Achieve Goals: Good Progress towards PT goals: Progressing toward goals    Frequency    Min 5X/week      PT Plan Current plan remains appropriate    Co-evaluation              AM-PAC PT "6 Clicks" Mobility   Outcome Measure  Help needed turning  from your back to your side while in a flat bed without using bedrails?: A Little Help needed moving from lying on your back to sitting on the side of a flat bed without using bedrails?: A Little Help needed moving to and from a bed to a chair (including a wheelchair)?: A Little Help needed standing up from a chair using your arms (e.g., wheelchair or bedside chair)?: A Little Help needed to walk in hospital room?: A Little Help needed climbing 3-5 steps with a railing? : A Little 6 Click Score: 18    End of Session Equipment Utilized During Treatment: Gait belt;Right knee immobilizer Activity Tolerance: Patient tolerated treatment well Patient left: in chair;with call bell/phone within reach Nurse Communication: Mobility status PT Visit Diagnosis: Unsteadiness on feet (R26.81);History of falling (Z91.81);Difficulty in walking, not elsewhere classified (R26.2);Pain Pain - Right/Left: Right Pain - part of body: Leg     Time: 1315-1330 PT Time Calculation (min) (ACUTE ONLY): 15 min  Charges:  $Gait Training: 8-22 mins                     Marietta Sikkema, PT, GCS 07/02/18,1:42 PM

## 2018-07-02 NOTE — Progress Notes (Signed)
Orthopaedic Trauma Progress Note  S: Doing well this AM, pain well controlled. Hypotension from yesterday improved. Denies any lightheadedness, SOB. No questions or concerns. Hopes to go home this afternoon after working with PT.  O:  Vitals:   07/02/18 0423 07/02/18 0740  BP: 100/70 115/62  Pulse: 97 87  Resp:  16  Temp: 99 F (37.2 C) 98.6 F (37 C)  SpO2: 94% 95%    General - Sitting up in bed, NAD. Alert and oriented x 3 Cardiac - Heart regular rate and rhythm Respiratory -  No increased work of breathing. Lungs CTA anterior lung fields bilaterally Right Lower Extremity - Hinged knee brace in place. Dressing removed, incsion is clean, dry, intact. Moderate tenderness to palpation of knee and with compression of calf. Knee flexion to about 30 degrees. Dorsiflexion/plantarflexion intact. Compartments soft and compressible. Sensation intact to light touch of extremity. +EHL. 2+ DP pulse  Imaging: Stable post op imaging  Labs:  No results found for this or any previous visit (from the past 24 hour(s)).  Assessment: 63 year old female s/p fall  Injuries: Right tibial plateau fracture s/p ORIF 06/30/18  Weightbearing: NWB RLE  Insicional and dressing care: Dressing can be changed as needed with adaptic 4x4s, ace wrap  Orthopedic device(s): Hinge knee brace RLE  Okay for brace to be unlocked during the day for knee ROM. Lock in full extension at night  CV/Blood loss: Acute blood loss anemia, Hgb 9.3 yesterday.   Pain management:  1. Tylenol 1000 mg q 8 hours scheduled 2. Robaxin 500 mg q 6 hours PRN 3. Oxycodone 5-10 mg q 4 hours PRN 4. Neurontin 100 mg TID 5. Dilaudid 0.5-1 mg q 3 hours PRN  VTE prophylaxis: Lovenox daily  ID: Ancef 2gm post op completed  Foley/Lines: No foley, KVO IVFs   Medical co-morbidities: None noted  Dispo:  PT eval, recommending home health. Hopefully d/c this afternoon  Follow - up plan: 2 weeks for suture removal and repeat  x-rays    Dalante Minus A. Carmie Kanner [facility] ?[phone]? (phone)

## 2018-07-02 NOTE — Progress Notes (Signed)
Pt given discharge instructions and gone over with her. Pt verbalized understanding of instructions. All belongings gathered to be sent home. Equipment delivered to room.

## 2018-07-02 NOTE — TOC Initial Note (Signed)
Transition of Care [facility]) - Initial/Assessment Note    Patient Details  Name: Theresa Dean MRN: [id number] Date of Birth: 1955/05/01  Transition [facility]) CM/SW Contact:    Alberteen Sam, [place] Phone Number: [phone] 07/02/2018, 10:36 AM  Clinical Narrative:                  CSW spoke with patient regarding home health services and equipment needs. Patient reports she may stay with her daughter or at her [place] as her current home has stairs. Patient reports her daughter is picking her up at discharged. Daughter lives in [place]. CSW has reached out to [employer] and set up equipment rolling walker and 3 in 1 to be delivered to room. CSW has reached out to multiple [facility] agencies for [facility] PT which have included [facility], [employer], [facility], and [employer]. No one is able to accept patient's insurance at this time, as most agencies are filled. CSW will continue calling [facility] agencies for anyone who may can accept.   Expected Discharge Plan: [place] Barriers to Discharge: Continued Medical Work up   Patient Goals and CMS Choice Patient states their goals for this hospitalization and ongoing recovery are:: to go home CMS Medicare.gov Compare Post Acute Care list provided to:: Patient Choice offered to / list presented to : Patient  Expected Discharge Plan and Services Expected Discharge Plan: [place] Choice: [place] arrangements for the past 2 months: Single Family Home Expected Discharge Date: 07/02/18               DME Arranged: Gilford Rile rolling, 3-N-1 DME Agency: [employer] Date DME Agency Contacted: 07/02/18 Time DME Agency Contacted: 0900 Representative spoke with at DME Agency: [place]            Prior Living Arrangements/Services Living arrangements for the past 2 months: [place] with:: Self Patient language and need for interpreter reviewed:: Yes Do you feel safe going back to the place  where you live?: Yes      Need for Family Participation in Patient Care: No (Comment) Care giver support system in place?: Yes (comment)   Criminal Activity/Legal Involvement Pertinent to Current Situation/Hospitalization: No - Comment as needed  Activities of Daily Living Home Assistive Devices/Equipment: None ADL Screening (condition at time of admission) Patient's cognitive ability adequate to safely complete daily activities?: Yes Is the patient deaf or have difficulty hearing?: No Does the patient have difficulty seeing, even when wearing glasses/contacts?: No Does the patient have difficulty concentrating, remembering, or making decisions?: No Patient able to express need for assistance with ADLs?: Yes Does the patient have difficulty dressing or bathing?: No Independently performs ADLs?: Yes (appropriate for developmental age) Does the patient have difficulty walking or climbing stairs?: Yes Weakness of Legs: Right Weakness of Arms/Hands: None  Permission Sought/Granted Permission sought to share information with : Case Manager, Family Supports, Chartered certified accountant granted to share information with : Yes, Verbal Permission Granted  Share Information with NAME: Ashlee  Permission granted to share info w AGENCY: HH  Permission granted to share info w Relationship: daughter  Permission granted to share info w Contact Information: [phone]  Emotional Assessment Appearance:: Other (Comment Required(unable to assess - via phone assessment) Attitude/Demeanor/Rapport: Gracious Affect (typically observed): Calm Orientation: : Oriented to Self, Oriented to Place, Oriented to  Time, Oriented to Situation Alcohol / Substance Use: Not Applicable Psych Involvement: No (  comment)  Admission diagnosis:  Tibia/fibula fracture, right, closed, initial encounter [S82.201A, S82.401A] Patient Active Problem List   Diagnosis Date Noted  . Right tibial fracture  07/01/2018  . Closed bicondylar fracture of right tibial plateau 06/29/2018   PCP:  Patient, No Pcp Per Pharmacy:   [id number] - [place], [place] - [address] [place] [id number] Phone: [phone] Fax: [phone]     Social Determinants of Health (SDOH) Interventions    Readmission Risk Interventions No flowsheet data found.

## 2018-07-20 ENCOUNTER — Encounter: Payer: Self-pay | Admitting: Student
